# Patient Record
Sex: Female | Born: 1995 | Race: White | Hispanic: No | Marital: Single | Smoking: Former smoker
Health system: Southern US, Community
[De-identification: ages and names within clinical notes are randomized; demographics above are authoritative.]

## PROBLEM LIST (undated history)

## (undated) DIAGNOSIS — R519 Headache, unspecified: Secondary | ICD-10-CM

## (undated) DIAGNOSIS — R011 Cardiac murmur, unspecified: Secondary | ICD-10-CM

## (undated) HISTORY — DX: Headache, unspecified: R51.9

## (undated) HISTORY — DX: Cardiac murmur, unspecified: R01.1

## (undated) HISTORY — PX: TONSILLECTOMY: SUR1361

---
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
## 2019-07-18 ENCOUNTER — Encounter: Payer: Self-pay | Admitting: *Deleted

## 2019-07-19 ENCOUNTER — Encounter: Payer: Self-pay | Admitting: Certified Nurse Midwife

## 2019-07-19 ENCOUNTER — Other Ambulatory Visit: Payer: Self-pay

## 2019-07-19 ENCOUNTER — Other Ambulatory Visit (HOSPITAL_COMMUNITY)
Admission: RE | Admit: 2019-07-19 | Discharge: 2019-07-19 | Disposition: A | Discharge status: Home / Self Care | Payer: 59 | Source: Ambulatory Visit | Attending: Certified Nurse Midwife | Admitting: Certified Nurse Midwife

## 2019-07-19 ENCOUNTER — Ambulatory Visit (INDEPENDENT_AMBULATORY_CARE_PROVIDER_SITE_OTHER): Payer: 59 | Admitting: Certified Nurse Midwife

## 2019-07-19 VITALS — BP 127/64 | HR 75 | Temp 98.9°F | Ht 62.5 in | Wt 176.0 lb

## 2019-07-19 DIAGNOSIS — O99211 Obesity complicating pregnancy, first trimester: Secondary | ICD-10-CM

## 2019-07-19 DIAGNOSIS — Z3400 Encounter for supervision of normal first pregnancy, unspecified trimester: Secondary | ICD-10-CM | POA: Diagnosis not present

## 2019-07-19 DIAGNOSIS — O99210 Obesity complicating pregnancy, unspecified trimester: Secondary | ICD-10-CM | POA: Insufficient documentation

## 2019-07-19 DIAGNOSIS — Z3A09 9 weeks gestation of pregnancy: Secondary | ICD-10-CM

## 2019-07-19 DIAGNOSIS — E669 Obesity, unspecified: Secondary | ICD-10-CM | POA: Diagnosis not present

## 2019-07-19 NOTE — Progress Notes (Signed)
Bedside U/S shows single IUP with FHT of 165 BPM and CRL measures 30.13mm  GA [redacted]w[redacted]d

## 2019-07-19 NOTE — Progress Notes (Signed)
History:   Lori Frederick is a 23 y.o. G1P0 at [redacted]w[redacted]d by LMP being seen today for her first obstetrical visit.  Her obstetrical history is significant for obesity. Patient does intend to breast feed. Pregnancy history fully reviewed.  Patient reports no complaints.     HISTORY: OB History  Gravida Para Term Preterm AB Living  1 0 0 0 0 0  SAB TAB Ectopic Multiple Live Births  0 0 0 0 0    # Outcome Date GA Lbr Len/2nd Weight Sex Delivery Anes PTL Lv  1 Current            She has not had pap smear in past. She denies vaginal discharge or odor.   Past Medical History:  Diagnosis Date  . Headache   . Heart murmur    Past Surgical History:  Procedure Laterality Date  . TONSILLECTOMY     Family History  Adopted: Yes   Social History   Tobacco Use  . Smoking status: Former Games developer  . Smokeless tobacco: Former Engineer, water Use Topics  . Alcohol use: Not Currently  . Drug use: Never   Allergies  Allergen Reactions  . Acetaminophen Rash   Current Outpatient Medications on File Prior to Visit  Medication Sig Dispense Refill  . Prenatal Vit-Fe Fumarate-FA (PRENATAL VITAMINS PO) Take by mouth.     No current facility-administered medications on file prior to visit.    Review of Systems Pertinent items noted in HPI and remainder of comprehensive ROS otherwise negative. Physical Exam:   Vitals:   07/19/19 0945 07/19/19 1010  BP: 127/64   Pulse: 75   Temp: 98.9 F (37.2 C)   Weight: 176 lb (79.8 kg)   Height:  5' 2.5" (1.588 m)   Fetal Heart Rate (bpm): 165 Pelvic Exam: Perineum: no hemorrhoids, normal perineum   Vulva: normal external genitalia, no lesions   Vagina:  normal mucosa, normal discharge   Cervix: no lesions and normal, pap smear done.    Adnexa: normal adnexa and no mass, fullness, tenderness   Bony Pelvis: average  System: General: well-developed, well-nourished female in no acute distress   Breasts:  normal appearance, no masses or  tenderness bilaterally   Skin: normal coloration and turgor, no rashes   Neurologic: oriented, normal, negative, normal mood   Extremities: normal strength, tone, and muscle mass, ROM of all joints is normal   HEENT PERRLA, extraocular movement intact and sclera clear, anicteric   Mouth/Teeth mucous membranes moist, pharynx normal without lesions and dental hygiene good   Neck supple and no masses   Cardiovascular: regular rate and rhythm   Respiratory:  no respiratory distress, normal breath sounds   Abdomen: soft, non-tender; bowel sounds normal; no masses,  no organomegaly     Assessment:    Pregnancy: G1P0 Patient Active Problem List   Diagnosis Date Noted  . Supervision of normal first pregnancy, antepartum 07/19/2019  . Obesity during pregnancy, antepartum 07/19/2019     Plan:    1. Supervision of normal first pregnancy, antepartum - Welcomed to practice and congratulated patient on pregnancy  - Reviewed safety, visitor policy, reassurance about COVID-19 for pregnancy at this time. Discussed possible changes to visits, including televisits, that may occur due to COVID-19.  The office remains open if pt needs to be seen and [facility] is open 24 hours/day for OB emergencies. - Routine prenatal care  - Anticipatory guidance on upcoming appointments including next visit in person to assess [facility]  with doppler then following appointment mychart  - Obstetric panel - HIV antibody (with reflex) - Cytology - PAP( [place]) - [place] bedside; Future - Babyscripts Schedule Optimization - Hepatitis C Antibody - Culture, OB Urine   2. Obesity during pregnancy, antepartum - HgB A1c - patient does not know fam hx as she was adopted    Initial labs drawn. Continue prenatal vitamins. Genetic Screening discussed, NIPS: requested. Ultrasound discussed; fetal anatomic survey: requested. Problem list reviewed and updated. The nature of [place] with  multiple MDs and other Advanced Practice Providers was explained to patient; also emphasized that residents, students are part of our team. Routine obstetric precautions reviewed. Return in about 4 weeks (around 08/16/2019) for ROB.     Lajean Manes, [place] for [employer], [place]

## 2019-07-19 NOTE — Patient Instructions (Signed)
First Trimester of Pregnancy  The first trimester of pregnancy is from week 1 until the end of week 13 (months 1 through 3). During this time, your baby will begin to develop inside you. At 6-8 weeks, the eyes and face are formed, and the heartbeat can be seen on ultrasound. At the end of 12 weeks, all the baby's organs are formed. Prenatal care is all the medical care you receive before the birth of your baby. Make sure you get good prenatal care and follow all of your doctor's instructions. Follow these instructions at home: Medicines  Take over-the-counter and prescription medicines only as told by your doctor. Some medicines are safe and some medicines are not safe during pregnancy.  Take a prenatal vitamin that contains at least 600 micrograms (mcg) of folic acid.  If you have trouble pooping (constipation), take medicine that will make your stool soft (stool softener) if your doctor approves. Eating and drinking   Eat regular, healthy meals.  Your doctor will tell you the amount of weight gain that is right for you.  Avoid raw meat and uncooked cheese.  If you feel sick to your stomach (nauseous) or throw up (vomit): ? Eat 4 or 5 small meals a day instead of 3 large meals. ? Try eating a few soda crackers. ? Drink liquids between meals instead of during meals.  To prevent constipation: ? Eat foods that are high in fiber, like fresh fruits and vegetables, whole grains, and beans. ? Drink enough fluids to keep your pee (urine) clear or pale yellow. Activity  Exercise only as told by your doctor. Stop exercising if you have cramps or pain in your lower belly (abdomen) or low back.  Do not exercise if it is too hot, too humid, or if you are in a place of great height (high altitude).  Try to avoid standing for long periods of time. Move your legs often if you must stand in one place for a long time.  Avoid heavy lifting.  Wear low-heeled shoes. Sit and stand up  straight.  You can have sex unless your doctor tells you not to. Relieving pain and discomfort  Wear a good support bra if your breasts are sore.  Take warm water baths (sitz baths) to soothe pain or discomfort caused by hemorrhoids. Use hemorrhoid cream if your doctor says it is okay.  Rest with your legs raised if you have leg cramps or low back pain.  If you have puffy, bulging veins (varicose veins) in your legs: ? Wear support hose or compression stockings as told by your doctor. ? Raise (elevate) your feet for 15 minutes, 3-4 times a day. ? Limit salt in your food. Prenatal care  Schedule your prenatal visits by the twelfth week of pregnancy.  Write down your questions. Take them to your prenatal visits.  Keep all your prenatal visits as told by your doctor. This is important. Safety  Wear your seat belt at all times when driving.  Make a list of emergency phone numbers. The list should include numbers for family, friends, the hospital, and police and fire departments. General instructions  Ask your doctor for a referral to a local prenatal class. Begin classes no later than at the start of month 6 of your pregnancy.  Ask for help if you need counseling or if you need help with nutrition. Your doctor can give you advice or tell you where to go for help.  Do not use hot tubs, steam   rooms, or saunas.  Do not douche or use tampons or scented sanitary pads.  Do not cross your legs for long periods of time.  Avoid all herbs and alcohol. Avoid drugs that are not approved by your doctor.  Do not use any tobacco products, including cigarettes, chewing tobacco, and electronic cigarettes. If you need help quitting, ask your doctor. You may get counseling or other support to help you quit.  Avoid cat litter boxes and soil used by cats. These carry germs that can cause birth defects in the baby and can cause a loss of your baby (miscarriage) or stillbirth.  Visit your dentist.  At home, brush your teeth with a soft toothbrush. Be gentle when you floss. Contact a doctor if:  You are dizzy.  You have mild cramps or pressure in your lower belly.  You have a nagging pain in your belly area.  You continue to feel sick to your stomach, you throw up, or you have watery poop (diarrhea).  You have a bad smelling fluid coming from your vagina.  You have pain when you pee (urinate).  You have increased puffiness (swelling) in your face, hands, legs, or ankles. Get help right away if:  You have a fever.  You are leaking fluid from your vagina.  You have spotting or bleeding from your vagina.  You have very bad belly cramping or pain.  You gain or lose weight rapidly.  You throw up blood. It may look like coffee grounds.  You are around people who have German measles, fifth disease, or chickenpox.  You have a very bad headache.  You have shortness of breath.  You have any kind of trauma, such as from a fall or a car accident. Summary  The first trimester of pregnancy is from week 1 until the end of week 13 (months 1 through 3).  To take care of yourself and your unborn baby, you will need to eat healthy meals, take medicines only if your doctor tells you to do so, and do activities that are safe for you and your baby.  Keep all follow-up visits as told by your doctor. This is important as your doctor will have to ensure that your baby is healthy and growing well. This information is not intended to replace advice given to you by your health care provider. Make sure you discuss any questions you have with your health care provider. Document Revised: 07/01/2018 Document Reviewed: 03/18/2016 Elsevier Patient Education  2020 [employer]  Safe Medications in Pregnancy   Acne: Benzoyl Peroxide Salicylic Acid  Backache/Headache: Tylenol: 2 regular strength every 4 hours OR              2 Extra strength every 6  hours  Colds/Coughs/Allergies: Benadryl (alcohol free) 25 mg every 6 hours as needed Breath right strips Claritin Cepacol throat lozenges Chloraseptic throat spray Cold-Eeze- up to three times per day Cough drops, alcohol free Flonase (by prescription only) Guaifenesin Mucinex Robitussin DM (plain only, alcohol free) Saline nasal spray/drops Sudafed (pseudoephedrine) & Actifed ** use only after [redacted] weeks gestation and if you do not have high blood pressure Tylenol Vicks Vaporub Zinc lozenges Zyrtec   Constipation: Colace Ducolax suppositories Fleet enema Glycerin suppositories Metamucil Milk of magnesia Miralax Senokot Smooth move tea  Diarrhea: Kaopectate Imodium A-D  *NO pepto Bismol  Hemorrhoids: Anusol Anusol HC Preparation H Tucks  Indigestion: Tums Maalox Mylanta Zantac  Pepcid  Insomnia: Benadryl (alcohol free) 25mg every 6 hours as needed   Tylenol PM Unisom, no Gelcaps  Leg Cramps: Tums MagGel  Nausea/Vomiting:  Bonine Dramamine Emetrol Ginger extract Sea bands Meclizine  Nausea medication to take during pregnancy:  Unisom (doxylamine succinate 25 mg tablets) Take one tablet daily at bedtime. If symptoms are not adequately controlled, the dose can be increased to a maximum recommended dose of two tablets daily (1/2 tablet in the morning, 1/2 tablet mid-afternoon and one at bedtime). Vitamin B6 100mg tablets. Take one tablet twice a day (up to 200 mg per day).  Skin Rashes: Aveeno products Benadryl cream or 25mg every 6 hours as needed Calamine Lotion 1% cortisone cream  Yeast infection: Gyne-lotrimin 7 Monistat 7   **If taking multiple medications, please check labels to avoid duplicating the same active ingredients **take medication as directed on the label ** Do not exceed 4000 mg of tylenol in 24 hours **Do not take medications that contain aspirin or ibuprofen   

## 2019-07-20 LAB — OBSTETRIC PANEL
Absolute Monocytes: 859 cells/uL (ref 200–950)
Antibody Screen: NOT DETECTED
Basophils Absolute: 17 cells/uL (ref 0–200)
Basophils Relative: 0.2 %
Eosinophils Absolute: 111 cells/uL (ref 15–500)
Eosinophils Relative: 1.3 %
HCT: 39.6 % (ref 35.0–45.0)
Hemoglobin: 13.2 g/dL (ref 11.7–15.5)
Hepatitis B Surface Ag: NONREACTIVE
Lymphs Abs: 1564 cells/uL (ref 850–3900)
MCH: 29.2 pg (ref 27.0–33.0)
MCHC: 33.3 g/dL (ref 32.0–36.0)
MCV: 87.6 fL (ref 80.0–100.0)
MPV: 11.1 fL (ref 7.5–12.5)
Monocytes Relative: 10.1 %
Neutro Abs: 5950 cells/uL (ref 1500–7800)
Neutrophils Relative %: 70 %
Platelets: 284 10*3/uL (ref 140–400)
RBC: 4.52 10*6/uL (ref 3.80–5.10)
RDW: 12.5 % (ref 11.0–15.0)
RPR Ser Ql: NONREACTIVE
Rubella: 3.2 Index
Total Lymphocyte: 18.4 %
WBC: 8.5 10*3/uL (ref 3.8–10.8)

## 2019-07-20 LAB — HEPATITIS C ANTIBODY
Hepatitis C Ab: NONREACTIVE
SIGNAL TO CUT-OFF: 0 (ref <1.00)

## 2019-07-20 LAB — HEMOGLOBIN A1C
Hgb A1c MFr Bld: 4.9 % of total Hgb (ref <5.7)
Mean Plasma Glucose: 94 (calc)
eAG (mmol/L): 5.2 (calc)

## 2019-07-20 LAB — HIV ANTIBODY (ROUTINE TESTING W REFLEX): HIV 1&2 Ab, 4th Generation: NONREACTIVE

## 2019-07-21 LAB — CYTOLOGY - PAP
Chlamydia: NEGATIVE
Comment: NEGATIVE
Comment: NEGATIVE
Comment: NORMAL
Diagnosis: NEGATIVE
Neisseria Gonorrhea: NEGATIVE
Trichomonas: NEGATIVE

## 2019-07-21 LAB — CULTURE, OB URINE

## 2019-07-21 LAB — URINE CULTURE, OB REFLEX: Organism ID, Bacteria: NO GROWTH

## 2019-08-09 ENCOUNTER — Ambulatory Visit (INDEPENDENT_AMBULATORY_CARE_PROVIDER_SITE_OTHER): Payer: 59

## 2019-08-09 ENCOUNTER — Other Ambulatory Visit: Payer: Self-pay

## 2019-08-09 DIAGNOSIS — Z3401 Encounter for supervision of normal first pregnancy, first trimester: Secondary | ICD-10-CM | POA: Diagnosis not present

## 2019-08-09 NOTE — Progress Notes (Signed)
Pt here for blood draw for [facility].

## 2019-08-15 ENCOUNTER — Encounter: Payer: Self-pay | Admitting: *Deleted

## 2019-08-15 DIAGNOSIS — Z3400 Encounter for supervision of normal first pregnancy, unspecified trimester: Secondary | ICD-10-CM

## 2019-08-16 ENCOUNTER — Encounter: Payer: Self-pay | Admitting: Certified Nurse Midwife

## 2019-08-16 ENCOUNTER — Encounter: Payer: Self-pay | Admitting: *Deleted

## 2019-08-16 ENCOUNTER — Ambulatory Visit (INDEPENDENT_AMBULATORY_CARE_PROVIDER_SITE_OTHER): Payer: 59 | Admitting: Certified Nurse Midwife

## 2019-08-16 ENCOUNTER — Other Ambulatory Visit: Payer: Self-pay

## 2019-08-16 VITALS — BP 103/59 | HR 65 | Wt 174.0 lb

## 2019-08-16 DIAGNOSIS — Z3400 Encounter for supervision of normal first pregnancy, unspecified trimester: Secondary | ICD-10-CM

## 2019-08-16 DIAGNOSIS — O99211 Obesity complicating pregnancy, first trimester: Secondary | ICD-10-CM

## 2019-08-16 DIAGNOSIS — Z3A13 13 weeks gestation of pregnancy: Secondary | ICD-10-CM

## 2019-08-16 DIAGNOSIS — O99210 Obesity complicating pregnancy, unspecified trimester: Secondary | ICD-10-CM

## 2019-08-16 MED ORDER — ASPIRIN EC 81 MG PO TBEC: 81.0000 mg | DELAYED_RELEASE_TABLET | Freq: Every day | ORAL | 0 refills | Status: DC

## 2019-08-16 NOTE — Patient Instructions (Signed)

## 2019-08-16 NOTE — Progress Notes (Signed)
   PRENATAL VISIT NOTE  Subjective:  Lori Frederick is a 24 y.o. G1P0 at [redacted]w[redacted]d being seen today for ongoing prenatal care.  She is currently monitored for the following issues for this low-risk pregnancy and has Supervision of normal first pregnancy, antepartum and Obesity during pregnancy, antepartum on their problem list.  Patient reports no complaints.  Contractions: Not present. Vag. Bleeding: None.  Movement: Absent. Denies leaking of fluid.   The following portions of the patient's history were reviewed and updated as appropriate: allergies, current medications, past family history, past medical history, past social history, past surgical history and problem list.   Objective:   Vitals:   08/16/19 1104  BP: (!) 103/59  Pulse: 65  Weight: 174 lb (78.9 kg)    Fetal Status: Fetal Heart Rate (bpm): 147   Movement: Absent     General:  Alert, oriented and cooperative. Patient is in no acute distress.  Skin: Skin is warm and dry. No rash noted.   Cardiovascular: Normal heart rate noted  Respiratory: Normal respiratory effort, no problems with respiration noted  Abdomen: Soft, gravid, appropriate for gestational age.  Pain/Pressure: Absent     Pelvic: Cervical exam deferred        Extremities: Normal range of motion.  Edema: None  Mental Status: Normal mood and affect. Normal behavior. Normal judgment and thought content.   Assessment and Plan:  Pregnancy: G1P0 at [redacted]w[redacted]d 1. Supervision of normal first pregnancy, antepartum - patient doing well, no complaints - educated and discussed fetal movement during pregnancy and when to expect around 18-20 weeks  - routine prenatal care - anticipatory guidance on upcoming appointments including ultrasound appointment and mychart appointment  - [place] MFM OB COMP + 14 WK; Future  2. Obesity during pregnancy, antepartum - educated and discussed recommended weight gain during pregnancy  - aspirin EC 81 MG tablet; Take 1 tablet (81 mg total)  by mouth daily. Take after 12 weeks for prevention of preeclampsia later in pregnancy  Dispense: 300 tablet; Refill: 0  Preterm labor symptoms and general obstetric precautions including but not limited to vaginal bleeding, contractions, leaking of fluid and fetal movement were reviewed in detail with the patient. Please refer to After Visit Summary for other counseling recommendations.   Return in about 4 weeks (around 09/13/2019) for ROB-mychart.  Future Appointments  Date Time Provider Department Center  09/13/2019  9:35 AM Rasch, Harolyn Rutherford, NP [email or web address]    Sharyon Cable, CNM

## 2019-08-29 ENCOUNTER — Telehealth: Payer: Self-pay | Admitting: *Deleted

## 2019-08-29 ENCOUNTER — Inpatient Hospital Stay (HOSPITAL_COMMUNITY)
Admission: AD | Admit: 2019-08-29 | Discharge: 2019-08-29 | Disposition: A | Discharge status: Home / Self Care | Payer: 59 | Attending: Obstetrics and Gynecology | Admitting: Obstetrics and Gynecology

## 2019-08-29 ENCOUNTER — Encounter (HOSPITAL_COMMUNITY): Payer: Self-pay | Admitting: Obstetrics and Gynecology

## 2019-08-29 ENCOUNTER — Other Ambulatory Visit: Payer: Self-pay

## 2019-08-29 DIAGNOSIS — R109 Unspecified abdominal pain: Secondary | ICD-10-CM | POA: Diagnosis not present

## 2019-08-29 DIAGNOSIS — Z79899 Other long term (current) drug therapy: Secondary | ICD-10-CM | POA: Insufficient documentation

## 2019-08-29 DIAGNOSIS — Z886 Allergy status to analgesic agent status: Secondary | ICD-10-CM | POA: Insufficient documentation

## 2019-08-29 DIAGNOSIS — N949 Unspecified condition associated with female genital organs and menstrual cycle: Secondary | ICD-10-CM

## 2019-08-29 DIAGNOSIS — Z7982 Long term (current) use of aspirin: Secondary | ICD-10-CM | POA: Insufficient documentation

## 2019-08-29 DIAGNOSIS — Z3A15 15 weeks gestation of pregnancy: Secondary | ICD-10-CM

## 2019-08-29 DIAGNOSIS — Z87891 Personal history of nicotine dependence: Secondary | ICD-10-CM | POA: Insufficient documentation

## 2019-08-29 DIAGNOSIS — O26892 Other specified pregnancy related conditions, second trimester: Secondary | ICD-10-CM

## 2019-08-29 LAB — URINALYSIS, ROUTINE W REFLEX MICROSCOPIC
Bilirubin Urine: NEGATIVE
Glucose, UA: NEGATIVE mg/dL
Hgb urine dipstick: NEGATIVE
Ketones, ur: NEGATIVE mg/dL
Leukocytes,Ua: NEGATIVE
Nitrite: NEGATIVE
Protein, ur: NEGATIVE mg/dL
Specific Gravity, Urine: 1.013 (ref 1.005–1.030)
pH: 7 (ref 5.0–8.0)

## 2019-08-29 MED ORDER — COMFORT FIT MATERNITY SUPP SM MISC
1.0000 [IU] | Freq: Every day | 0 refills | Status: DC | PRN
Start: 2019-08-29 — End: 2020-05-11

## 2019-08-29 NOTE — Discharge Instructions (Signed)
PREGNANCY SUPPORT BELT: You are not alone, Seventy-five percent of women have some sort of abdominal or back pain at some point in their pregnancy. Your baby is growing at a fast pace, which means that your whole body is rapidly trying to adjust to the changes. As your uterus grows, your back may start feeling a bit under stress and this can result in back or abdominal pain that can go from mild, and therefore bearable, to severe pains that will not allow you to sit or lay down comfortably, When it comes to dealing with pregnancy-related pains and cramps, some pregnant women usually prefer natural remedies, which the market is filled with nowadays. For example, wearing a pregnancy support belt can help ease and lessen your discomfort and pain. WHAT ARE THE BENEFITS OF WEARING A PREGNANCY SUPPORT BELT? A pregnancy support belt provides support to the lower portion of the belly taking some of the weight of the growing uterus and distributing to the other parts of your body. It is designed make you comfortable and gives you extra support. Over the years, the pregnancy apparel market has been studying the needs and wants of pregnant women and they have come up with the most comfortable pregnancy support belts that woman could ever ask for. In fact, you will no longer have to wear a stretched-out or bulky pregnancy belt that is visible underneath your clothes and makes you feel even more uncomfortable. Nowadays, a pregnancy support belt is made of comfortable and stretchy materials that will not irritate your skin but will actually make you feel at ease and you will not even notice you are wearing it. They are easy to put on and adjust during the day and can be worn at night for additional support.  BENEFITS:  Relives Back pain  Relieves Abdominal Muscle and Leg Pain  Stabilizes the Pelvic Ring  Offers a Cushioned Abdominal Lift Pad  Relieves pressure on the Sciatic Nerve Within Minutes WHERE TO GET  YOUR PREGNANCY BELT: [employer] [phone] @[phone]  [address] [place], [place]      Round Ligament Pain  The round ligament is a cord of muscle and tissue that helps support the uterus. It can become a source of pain during pregnancy if it becomes stretched or twisted as the baby grows. The pain usually begins in the second trimester (13-28 weeks) of pregnancy, and it can come and go until the baby is delivered. It is not a serious problem, and it does not cause harm to the baby. Round ligament pain is usually a short, sharp, and pinching pain, but it can also be a dull, lingering, and aching pain. The pain is felt in the lower side of the abdomen or in the groin. It usually starts deep in the groin and moves up to the outside of the hip area. The pain may occur when you:  Suddenly change position, such as quickly going from a sitting to standing position.  Roll over in bed.  Cough or sneeze.  Do physical activity. Follow these instructions at home:   Watch your condition for any changes.  When the pain starts, relax. Then try any of these methods to help with the pain: ? Sitting down. ? Flexing your knees up to your abdomen. ? Lying on your side with one pillow under your abdomen and another pillow between your legs. ? Sitting in a warm bath for 15-20 minutes or until the pain goes away.  Take  over-the-counter and prescription medicines only as told by your health care provider.  Move slowly when you sit down or stand up.  Avoid long walks if they cause pain.  Stop or reduce your physical activities if they cause pain.  Keep all follow-up visits as told by your health care provider. This is important. Contact a health care provider if:  Your pain does not go away with treatment.  You feel pain in your back that you did not have before.  Your medicine is not helping. Get help right away if:  You have a fever or chills.  You develop uterine  contractions.  You have vaginal bleeding.  You have nausea or vomiting.  You have diarrhea.  You have pain when you urinate. Summary  Round ligament pain is felt in the lower abdomen or groin. It is usually a short, sharp, and pinching pain. It can also be a dull, lingering, and aching pain.  This pain usually begins in the second trimester (13-28 weeks). It occurs because the uterus is stretching with the growing baby, and it is not harmful to the baby.  You may notice the pain when you suddenly change position, when you cough or sneeze, or during physical activity.  Relaxing, flexing your knees to your abdomen, lying on one side, or taking a warm bath may help to get rid of the pain.  Get help from your health care provider if the pain does not go away or if you have vaginal bleeding, nausea, vomiting, diarrhea, or painful urination. This information is not intended to replace advice given to you by your health care provider. Make sure you discuss any questions you have with your health care provider. Document Revised: 08/26/2017 Document Reviewed: 08/26/2017 Elsevier Patient Education  2020 Else[employer] Inc.         Abdominal Pain During Pregnancy  Abdominal pain is common during pregnancy, and has many possible causes. Some causes are more serious than others, and sometimes the cause is not known. Abdominal pain can be a sign that labor is starting. It can also be caused by normal growth and stretching of muscles and ligaments during pregnancy. Always tell your health care provider if you have any abdominal pain. Follow these instructions at home:  Do not have sex or put anything in your vagina until your pain goes away completely.  Get plenty of rest until your pain improves.  Drink enough fluid to keep your urine pale yellow.  Take over-the-counter and prescription medicines only as told by your health care provider.  Keep all follow-up visits as told by your health  care provider. This is important. Contact a health care provider if:  Your pain continues or gets worse after resting.  You have lower abdominal pain that: ? Comes and goes at regular intervals. ? Spreads to your back. ? Is similar to menstrual cramps.  You have pain or burning when you urinate. Get help right away if:  You have a fever or chills.  You have vaginal bleeding.  You are leaking fluid from your vagina.  You are passing tissue from your vagina.  You have vomiting or diarrhea that lasts for more than 24 hours.  Your baby is moving less than usual.  You feel very weak or faint.  You have shortness of breath.  You develop severe pain in your upper abdomen. Summary  Abdominal pain is common during pregnancy, and has many possible causes.  If you experience abdominal pain during pregnancy, tell  your health care provider right away.  Follow your health care provider's home care instructions and keep all follow-up visits as directed. This information is not intended to replace advice given to you by your health care provider. Make sure you discuss any questions you have with your health care provider. Document Revised: 06/28/2018 Document Reviewed: 06/12/2016 Elsevier Patient Education  2020 [employer].        Second Trimester of Pregnancy The second trimester is from week 14 through week 27 (months 4 through 6). The second trimester is often a time when you feel your best. Your body has adjusted to being pregnant, and you begin to feel better physically. Usually, morning sickness has lessened or quit completely, you may have more energy, and you may have an increase in appetite. The second trimester is also a time when the fetus is growing rapidly. At the end of the sixth month, the fetus is about 9 inches long and weighs about 1 pounds. You will likely begin to feel the baby move (quickening) between 16 and 20 weeks of pregnancy. Body changes during your  second trimester Your body continues to go through many changes during your second trimester. The changes vary from woman to woman.  Your weight will continue to increase. You will notice your lower abdomen bulging out.  You may begin to get stretch marks on your hips, abdomen, and breasts.  You may develop headaches that can be relieved by medicines. The medicines should be approved by your health care provider.  You may urinate more often because the fetus is pressing on your bladder.  You may develop or continue to have heartburn as a result of your pregnancy.  You may develop constipation because certain hormones are causing the muscles that push waste through your intestines to slow down.  You may develop hemorrhoids or swollen, bulging veins (varicose veins).  You may have back pain. This is caused by: ? Weight gain. ? Pregnancy hormones that are relaxing the joints in your pelvis. ? A shift in weight and the muscles that support your balance.  Your breasts will continue to grow and they will continue to become tender.  Your gums may bleed and may be sensitive to brushing and flossing.  Dark spots or blotches (chloasma, mask of pregnancy) may develop on your face. This will likely fade after the baby is born.  A dark line from your belly button to the pubic area (linea nigra) may appear. This will likely fade after the baby is born.  You may have changes in your hair. These can include thickening of your hair, rapid growth, and changes in texture. Some women also have hair loss during or after pregnancy, or hair that feels dry or thin. Your hair will most likely return to normal after your baby is born. What to expect at prenatal visits During a routine prenatal visit:  You will be weighed to make sure you and the fetus are growing normally.  Your blood pressure will be taken.  Your abdomen will be measured to track your baby's growth.  The fetal heartbeat will be  listened to.  Any test results from the previous visit will be discussed. Your health care provider may ask you:  How you are feeling.  If you are feeling the baby move.  If you have had any abnormal symptoms, such as leaking fluid, bleeding, severe headaches, or abdominal cramping.  If you are using any tobacco products, including cigarettes, chewing tobacco, and electronic  cigarettes.  If you have any questions. Other tests that may be performed during your second trimester include:  Blood tests that check for: ? Low iron levels (anemia). ? High blood sugar that affects pregnant women (gestational diabetes) between 78 and 28 weeks. ? Rh antibodies. This is to check for a protein on red blood cells (Rh factor).  Urine tests to check for infections, diabetes, or protein in the urine.  An ultrasound to confirm the proper growth and development of the baby.  An amniocentesis to check for possible genetic problems.  Fetal screens for spina bifida and Down syndrome.  HIV (human immunodeficiency virus) testing. Routine prenatal testing includes screening for HIV, unless you choose not to have this test. Follow these instructions at home: Medicines  Follow your health care provider's instructions regarding medicine use. Specific medicines may be either safe or unsafe to take during pregnancy.  Take a prenatal vitamin that contains at least 600 micrograms (mcg) of folic acid.  If you develop constipation, try taking a stool softener if your health care provider approves. Eating and drinking   Eat a balanced diet that includes fresh fruits and vegetables, whole grains, good sources of protein such as meat, eggs, or tofu, and low-fat dairy. Your health care provider will help you determine the amount of weight gain that is right for you.  Avoid raw meat and uncooked cheese. These carry germs that can cause birth defects in the baby.  If you have low calcium intake from food, talk  to your health care provider about whether you should take a daily calcium supplement.  Limit foods that are high in fat and processed sugars, such as fried and sweet foods.  To prevent constipation: ? Drink enough fluid to keep your urine clear or pale yellow. ? Eat foods that are high in fiber, such as fresh fruits and vegetables, whole grains, and beans. Activity  Exercise only as directed by your health care provider. Most women can continue their usual exercise routine during pregnancy. Try to exercise for 30 minutes at least 5 days a week. Stop exercising if you experience uterine contractions.  Avoid heavy lifting, wear low heel shoes, and practice good posture.  A sexual relationship may be continued unless your health care provider directs you otherwise. Relieving pain and discomfort  Wear a good support bra to prevent discomfort from breast tenderness.  Take warm sitz baths to soothe any pain or discomfort caused by hemorrhoids. Use hemorrhoid cream if your health care provider approves.  Rest with your legs elevated if you have leg cramps or low back pain.  If you develop varicose veins, wear support hose. Elevate your feet for 15 minutes, 3-4 times a day. Limit salt in your diet. Prenatal Care  Write down your questions. Take them to your prenatal visits.  Keep all your prenatal visits as told by your health care provider. This is important. Safety  Wear your seat belt at all times when driving.  Make a list of emergency phone numbers, including numbers for family, friends, the hospital, and police and fire departments. General instructions  Ask your health care provider for a referral to a local prenatal education class. Begin classes no later than the beginning of month 6 of your pregnancy.  Ask for help if you have counseling or nutritional needs during pregnancy. Your health care provider can offer advice or refer you to specialists for help with various  needs.  Do not use hot tubs, steam rooms,  or saunas.  Do not douche or use tampons or scented sanitary pads.  Do not cross your legs for long periods of time.  Avoid cat litter boxes and soil used by cats. These carry germs that can cause birth defects in the baby and possibly loss of the fetus by miscarriage or stillbirth.  Avoid all smoking, herbs, alcohol, and unprescribed drugs. Chemicals in these products can affect the formation and growth of the baby.  Do not use any products that contain nicotine or tobacco, such as cigarettes and e-cigarettes. If you need help quitting, ask your health care provider.  Visit your dentist if you have not gone yet during your pregnancy. Use a soft toothbrush to brush your teeth and be gentle when you floss. Contact a health care provider if:  You have dizziness.  You have mild pelvic cramps, pelvic pressure, or nagging pain in the abdominal area.  You have persistent nausea, vomiting, or diarrhea.  You have a bad smelling vaginal discharge.  You have pain when you urinate. Get help right away if:  You have a fever.  You are leaking fluid from your vagina.  You have spotting or bleeding from your vagina.  You have severe abdominal cramping or pain.  You have rapid weight gain or weight loss.  You have shortness of breath with chest pain.  You notice sudden or extreme swelling of your face, hands, ankles, feet, or legs.  You have not felt your baby move in over an hour.  You have severe headaches that do not go away when you take medicine.  You have vision changes. Summary  The second trimester is from week 14 through week 27 (months 4 through 6). It is also a time when the fetus is growing rapidly.  Your body goes through many changes during pregnancy. The changes vary from woman to woman.  Avoid all smoking, herbs, alcohol, and unprescribed drugs. These chemicals affect the formation and growth your baby.  Do not use any  tobacco products, such as cigarettes, chewing tobacco, and e-cigarettes. If you need help quitting, ask your health care provider.  Contact your health care provider if you have any questions. Keep all prenatal visits as told by your health care provider. This is important. This information is not intended to replace advice given to you by your health care provider. Make sure you discuss any questions you have with your health care provider. Document Revised: 07/02/2018 Document Reviewed: 04/15/2016 Elsevier Patient Education  2020 [employer].

## 2019-08-29 NOTE — MAU Note (Signed)
.  Lori Frederick is a 24 y.o. at [redacted]w[redacted]d here in MAU reporting: she woke up to a sharp pain that would not go away until about 0600. Pt reports she has had cramps all day today. No VB or abnormal discharge.   Onset of complaint: 08/29/19 at 0430 Pain score: 0/10  FHT: 148 doppler

## 2019-08-29 NOTE — MAU Provider Note (Signed)
History     CSN: [id number]  Arrival date and time: 08/29/19 2031   First Provider Initiated Contact with Patient 08/29/19 2125      Chief Complaint  Patient presents with  . Abdominal Pain   [name] is a 24 y.o. G1P0 at [redacted]w[redacted]d who presents to MAU for abdominal pain.  Onset: 0600AM Location: bilateral pelvis Duration: 1.5hours this morning, shorter durations since then Character: sharp, "felt like something was coming through my stomach" Aggravating/Associated: movement/none Relieving: lying still Treatment: none Severity: 0/10  Pt denies VB, LOF, ctx, vaginal discharge/odor/itching. Pt denies N/V, abdominal pain, constipation, diarrhea, or urinary problems. Pt denies fever, chills, fatigue, sweating or changes in appetite. Pt denies SOB or chest pain. Pt denies dizziness, HA, light-headedness, weakness.  Problems this pregnancy include: obesity. Allergies? ibuprofen Current medications/supplements? BASA, PNVs Prenatal care provider? [facility] [place], next appt 09/13/2019   OB History    Gravida  1   Para      Term      Preterm      AB      Living        SAB      TAB      Ectopic      Multiple      Live Births              Past Medical History:  Diagnosis Date  . Headache   . Heart murmur     Past Surgical History:  Procedure Laterality Date  . TONSILLECTOMY      Family History  Adopted: Yes    Social History   Tobacco Use  . Smoking status: Former Games developer  . Smokeless tobacco: Former Engineer, water Use Topics  . Alcohol use: Not Currently  . Drug use: Never    Allergies:  Allergies  Allergen Reactions  . Ibuprofen Itching and Swelling    Medications Prior to Admission  Medication Sig Dispense Refill Last Dose  . acetaminophen (TYLENOL) 500 MG tablet Take 500 mg by mouth every 6 (six) hours as needed.   Past Month at Unknown time  . aspirin EC 81 MG tablet Take 1 tablet (81 mg total) by mouth daily. Take  after 12 weeks for prevention of preeclampsia later in pregnancy 300 tablet 0 Past Week at Unknown time  . Prenatal Vit-Fe Fumarate-FA (PRENATAL VITAMINS PO) Take by mouth.   08/29/2019 at Unknown time    Review of Systems  Constitutional: Negative for chills, diaphoresis, fatigue and fever.  Eyes: Negative for visual disturbance.  Respiratory: Negative for shortness of breath.   Cardiovascular: Negative for chest pain.  Gastrointestinal: Positive for abdominal pain. Negative for constipation, diarrhea, nausea and vomiting.  Genitourinary: Negative for dysuria, flank pain, frequency, pelvic pain, urgency, vaginal bleeding and vaginal discharge.  Neurological: Negative for dizziness, weakness, light-headedness and headaches.   Physical Exam   Blood pressure 109/60, pulse 71, temperature 98.1 F (36.7 C), temperature source Oral, resp. rate 17, weight 82.1 kg, last menstrual period 05/15/2019, SpO2 99 %.  Patient Vitals for the past 24 hrs:  BP Temp Temp src Pulse Resp SpO2 Weight  08/29/19 2136 109/60 -- -- 71 -- -- --  08/29/19 2106 124/87 98.1 F (36.7 C) Oral 92 17 99 % --  08/29/19 2059 -- -- -- -- -- -- 82.1 kg   Physical Exam  Constitutional: She is oriented to person, place, and time. She appears well-developed and well-nourished. No distress.  HENT:  Head: Normocephalic and atraumatic.  Respiratory: Effort normal.  GI: Soft. She exhibits no distension and no mass. There is no abdominal tenderness. There is no rebound and no guarding.  Genitourinary: There is no rash, tenderness or lesion on the right labia. There is no rash, tenderness or lesion on the left labia.    Genitourinary Comments: CE: long/closed/posterior   Neurological: She is alert and oriented to person, place, and time.  Skin: Skin is warm and dry. She is not diaphoretic.  Psychiatric: She has a normal mood and affect. Her behavior is normal. Judgment and thought content normal.   Results for orders placed or  performed during the hospital encounter of 08/29/19 (from the past 24 hour(s))  Urinalysis, Routine w reflex microscopic     Status: Abnormal   Collection Time: 08/29/19  8:59 PM  Result Value Ref Range   Color, Urine YELLOW YELLOW   APPearance HAZY (A) CLEAR   Specific Gravity, Urine 1.013 1.005 - 1.030   pH 7.0 5.0 - 8.0   Glucose, UA NEGATIVE NEGATIVE mg/dL   Hgb urine dipstick NEGATIVE NEGATIVE   Bilirubin Urine NEGATIVE NEGATIVE   Ketones, ur NEGATIVE NEGATIVE mg/dL   Protein, ur NEGATIVE NEGATIVE mg/dL   Nitrite NEGATIVE NEGATIVE   Leukocytes,Ua NEGATIVE NEGATIVE    MAU Course  Procedures  MDM -suspect RLP, r/o PTL -UA: hazy, sending urine for culture based on symptoms -CE: long/closed/posterior -fFN: n/a d/t GA -GC/CT/trich negative on Pap one month ago, no new partners since that time -FHT 148 -pt not experiencing pain at this time, no pain medication given -pt discharged to home in stable condition  Orders Placed This Encounter  Procedures  . Culture, OB Urine    Standing Status:   Standing    Number of Occurrences:   1  . Urinalysis, Routine w reflex microscopic    Standing Status:   Standing    Number of Occurrences:   1  . Discharge patient    Order Specific Question:   Discharge disposition    Answer:   01-Home or Self Care [1]    Order Specific Question:   Discharge patient date    Answer:   08/29/2019   Meds ordered this encounter  Medications  . Elastic Bandages & Supports (COMFORT FIT MATERNITY SUPP SM) MISC    Sig: 1 Units by Does not apply route daily as needed.    Dispense:  1 each    Refill:  0    Order Specific Question:   Supervising Provider    Answer:   Aletha Halim [[id number]]    Assessment and Plan   1. Round ligament pain   2. [redacted] weeks gestation of pregnancy     Allergies as of 08/29/2019      Reactions   Ibuprofen Itching, Swelling      Medication List    TAKE these medications   acetaminophen 500 MG tablet Commonly known  as: TYLENOL Take 500 mg by mouth every 6 (six) hours as needed.   aspirin EC 81 MG tablet Take 1 tablet (81 mg total) by mouth daily. Take after 12 weeks for prevention of preeclampsia later in pregnancy   [place] 1 Units by Does not apply route daily as needed.   PRENATAL VITAMINS PO Take by mouth.        -will call with culture results, if positive -pt declines offer for Flexeril, will try Tylenol at home PRN -RX pregnancy support belt with instructions -round ligament pain vs.  PTL discussed -return MAU precautions given -pt discharged to home in stable condition  Odie Sera Samia Kukla 08/29/2019, 9:47 PM
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
## 2019-08-30 LAB — CULTURE, OB URINE

## 2019-09-13 ENCOUNTER — Encounter: Payer: Self-pay | Admitting: Obstetrics and Gynecology

## 2019-09-13 ENCOUNTER — Telehealth (INDEPENDENT_AMBULATORY_CARE_PROVIDER_SITE_OTHER): Payer: 59 | Admitting: Obstetrics and Gynecology

## 2019-09-13 DIAGNOSIS — Z3401 Encounter for supervision of normal first pregnancy, first trimester: Secondary | ICD-10-CM

## 2019-09-13 DIAGNOSIS — Z3400 Encounter for supervision of normal first pregnancy, unspecified trimester: Secondary | ICD-10-CM

## 2019-09-13 DIAGNOSIS — Z3A17 17 weeks gestation of pregnancy: Secondary | ICD-10-CM

## 2019-09-13 NOTE — Patient Instructions (Signed)
Contraception Choices Contraception, also called birth control, refers to methods or devices that prevent pregnancy. Hormonal methods Contraceptive implant  A contraceptive implant is a thin, plastic tube that contains a hormone. It is inserted into the upper part of the arm. It can remain in place for up to 3 years. Progestin-only injections Progestin-only injections are injections of progestin, a synthetic form of the hormone progesterone. They are given every 3 months by a health care provider. Birth control pills  Birth control pills are pills that contain hormones that prevent pregnancy. They must be taken once a day, preferably at the same time each day. Birth control patch  The birth control patch contains hormones that prevent pregnancy. It is placed on the skin and must be changed once a week for three weeks and removed on the fourth week. A prescription is needed to use this method of contraception. Vaginal ring  A vaginal ring contains hormones that prevent pregnancy. It is placed in the vagina for three weeks and removed on the fourth week. After that, the process is repeated with a new ring. A prescription is needed to use this method of contraception. Emergency contraceptive Emergency contraceptives prevent pregnancy after unprotected sex. They come in pill form and can be taken up to 5 days after sex. They work best the sooner they are taken after having sex. Most emergency contraceptives are available without a prescription. This method should not be used as your only form of birth control. Barrier methods Female condom  A female condom is a thin sheath that is worn over the penis during sex. Condoms keep sperm from going inside a woman's body. They can be used with a spermicide to increase their effectiveness. They should be disposed after a single use. Female condom  A female condom is a soft, loose-fitting sheath that is put into the vagina before sex. The condom keeps sperm  from going inside a woman's body. They should be disposed after a single use. Diaphragm  A diaphragm is a soft, dome-shaped barrier. It is inserted into the vagina before sex, along with a spermicide. The diaphragm blocks sperm from entering the uterus, and the spermicide kills sperm. A diaphragm should be left in the vagina for 6-8 hours after sex and removed within 24 hours. A diaphragm is prescribed and fitted by a health care provider. A diaphragm should be replaced every 1-2 years, after giving birth, after gaining more than 15 lb (6.8 kg), and after pelvic surgery. Cervical cap  A cervical cap is a round, soft latex or plastic cup that fits over the cervix. It is inserted into the vagina before sex, along with spermicide. It blocks sperm from entering the uterus. The cap should be left in place for 6-8 hours after sex and removed within 48 hours. A cervical cap must be prescribed and fitted by a health care provider. It should be replaced every 2 years. Sponge  A sponge is a soft, circular piece of polyurethane foam with spermicide on it. The sponge helps block sperm from entering the uterus, and the spermicide kills sperm. To use it, you make it wet and then insert it into the vagina. It should be inserted before sex, left in for at least 6 hours after sex, and removed and thrown away within 30 hours. Spermicides Spermicides are chemicals that kill or block sperm from entering the cervix and uterus. They can come as a cream, jelly, suppository, foam, or tablet. A spermicide should be inserted into the   vagina with an applicator at least 10-15 minutes before sex to allow time for it to work. The process must be repeated every time you have sex. Spermicides do not require a prescription. Intrauterine contraception Intrauterine device (IUD) An IUD is a T-shaped device that is put in a woman's uterus. There are two types:  Hormone IUD.This type contains progestin, a synthetic form of the hormone  progesterone. This type can stay in place for 3-5 years.  Copper IUD.This type is wrapped in copper wire. It can stay in place for 10 years.  Permanent methods of contraception Female tubal ligation In this method, a woman's fallopian tubes are sealed, tied, or blocked during surgery to prevent eggs from traveling to the uterus. Hysteroscopic sterilization In this method, a small, flexible insert is placed into each fallopian tube. The inserts cause scar tissue to form in the fallopian tubes and block them, so sperm cannot reach an egg. The procedure takes about 3 months to be effective. Another form of birth control must be used during those 3 months. Female sterilization This is a procedure to tie off the tubes that carry sperm (vasectomy). After the procedure, the man can still ejaculate fluid (semen). Natural planning methods Natural family planning In this method, a couple does not have sex on days when the woman could become pregnant. Calendar method This means keeping track of the length of each menstrual cycle, identifying the days when pregnancy can happen, and not having sex on those days. Ovulation method In this method, a couple avoids sex during ovulation. Symptothermal method This method involves not having sex during ovulation. The woman typically checks for ovulation by watching changes in her temperature and in the consistency of cervical mucus. Post-ovulation method In this method, a couple waits to have sex until after ovulation. Summary  Contraception, also called birth control, means methods or devices that prevent pregnancy.  Hormonal methods of contraception include implants, injections, pills, patches, vaginal rings, and emergency contraceptives.  Barrier methods of contraception can include female condoms, female condoms, diaphragms, cervical caps, sponges, and spermicides.  There are two types of IUDs (intrauterine devices). An IUD can be put in a woman's uterus to  prevent pregnancy for 3-5 years.  Permanent sterilization can be done through a procedure for males, females, or both.  Natural family planning methods involve not having sex on days when the woman could become pregnant. This information is not intended to replace advice given to you by your health care provider. Make sure you discuss any questions you have with your health care provider. Document Revised: 03/12/2017 Document Reviewed: 04/12/2016 Elsevier Patient Education  2020 [employer]vier Inc.  

## 2019-09-13 NOTE — Progress Notes (Signed)
Pt will take BP and wt this afternoon and call.

## 2019-09-13 NOTE — Progress Notes (Signed)
   TELEHEALTH OBSTETRICS VISIT ENCOUNTER NOTE  I connected with Lori Frederick on 09/13/19 at  9:35 AM EDT by telephone at home and verified that I am speaking with the correct person using two identifiers.   I discussed the limitations, risks, security and privacy concerns of performing an evaluation and management service by telephone and the availability of in person appointments. I also discussed with the patient that there may be a patient responsible charge related to this service. The patient expressed understanding and agreed to proceed.  Subjective:  Lori Frederick is a 24 y.o. G1P0 at [redacted]w[redacted]d being followed for ongoing prenatal care.  She is currently monitored for the following issues for this low-risk pregnancy and has Supervision of normal first pregnancy, antepartum and Obesity during pregnancy, antepartum on their problem list.  Patient reports no complaints. Reports fetal movement. Denies any contractions, bleeding or leaking of fluid.   The following portions of the patient's history were reviewed and updated as appropriate: allergies, current medications, past family history, past medical history, past social history, past surgical history and problem list.   Objective:   General:  Alert, oriented and cooperative.   Mental Status: Normal mood and affect perceived. Normal judgment and thought content.  Rest of physical exam deferred due to type of encounter  Assessment and Plan:  Pregnancy: G1P0 at [redacted]w[redacted]d  1. Supervision of normal first pregnancy, antepartum  Continue BASA daily Doing well, feeling  Problem list updated Discussed [facility] website. Will check BP later today and enter into babyscripts.    Preterm labor symptoms and general obstetric precautions including but not limited to vaginal bleeding, contractions, leaking of fluid and fetal movement were reviewed in detail with the patient.  I discussed the assessment and treatment plan with the  patient. The patient was provided an opportunity to ask questions and all were answered. The patient agreed with the plan and demonstrated an understanding of the instructions. The patient was advised to call back or seek an in-person office evaluation/go to MAU at [facility] for any urgent or concerning symptoms. Please refer to After Visit Summary for other counseling recommendations.   I provided 10 minutes of non-face-to-face time during this encounter.  Return in about 4 weeks (around 10/11/2019) for In-person visit per patient request. .  Future Appointments  Date Time Provider Department Center  09/27/2019 10:45 AM [facility] US5 [facility]    Venia Carbon, NP [facility] [employer], [facility]

## 2019-09-27 ENCOUNTER — Other Ambulatory Visit: Payer: Self-pay | Admitting: *Deleted

## 2019-09-27 ENCOUNTER — Other Ambulatory Visit: Payer: Self-pay

## 2019-09-27 ENCOUNTER — Ambulatory Visit: Payer: 59 | Attending: Obstetrics and Gynecology

## 2019-09-27 ENCOUNTER — Other Ambulatory Visit: Payer: Self-pay | Admitting: Certified Nurse Midwife

## 2019-09-27 DIAGNOSIS — E669 Obesity, unspecified: Secondary | ICD-10-CM

## 2019-09-27 DIAGNOSIS — Z3400 Encounter for supervision of normal first pregnancy, unspecified trimester: Secondary | ICD-10-CM | POA: Diagnosis not present

## 2019-09-27 DIAGNOSIS — O99212 Obesity complicating pregnancy, second trimester: Secondary | ICD-10-CM

## 2019-09-27 DIAGNOSIS — Z363 Encounter for antenatal screening for malformations: Secondary | ICD-10-CM

## 2019-09-27 DIAGNOSIS — O99210 Obesity complicating pregnancy, unspecified trimester: Secondary | ICD-10-CM

## 2019-09-27 DIAGNOSIS — Z3A19 19 weeks gestation of pregnancy: Secondary | ICD-10-CM

## 2019-10-11 ENCOUNTER — Encounter: Payer: 59 | Admitting: Advanced Practice Midwife

## 2019-10-18 ENCOUNTER — Telehealth (INDEPENDENT_AMBULATORY_CARE_PROVIDER_SITE_OTHER): Payer: 59 | Admitting: Advanced Practice Midwife

## 2019-10-18 VITALS — BP 108/53 | HR 76 | Wt 187.0 lb

## 2019-10-18 DIAGNOSIS — E669 Obesity, unspecified: Secondary | ICD-10-CM

## 2019-10-18 DIAGNOSIS — Z3A22 22 weeks gestation of pregnancy: Secondary | ICD-10-CM

## 2019-10-18 DIAGNOSIS — O26892 Other specified pregnancy related conditions, second trimester: Secondary | ICD-10-CM

## 2019-10-18 DIAGNOSIS — O99212 Obesity complicating pregnancy, second trimester: Secondary | ICD-10-CM

## 2019-10-18 DIAGNOSIS — R0602 Shortness of breath: Secondary | ICD-10-CM

## 2019-10-18 DIAGNOSIS — Z3400 Encounter for supervision of normal first pregnancy, unspecified trimester: Secondary | ICD-10-CM

## 2019-10-18 NOTE — Progress Notes (Signed)
Pt c/o SOB

## 2019-10-18 NOTE — Progress Notes (Signed)
OBSTETRICS PRENATAL VIRTUAL VISIT ENCOUNTER NOTE  Provider location: [facility] for [facility] [place]   I connected with Lori Frederick on 10/18/19 at  3:20 PM EDT by MyChart Video Encounter at home and verified that I am speaking with the correct person using two identifiers.   I discussed the limitations, risks, security and privacy concerns of performing an evaluation and management service virtually and the availability of in person appointments. I also discussed with the patient that there may be a patient responsible charge related to this service. The patient expressed understanding and agreed to proceed. Subjective:  Lori Frederick is a 24 y.o. G1P0 at [redacted]w[redacted]d being seen today for ongoing prenatal care.  She is currently monitored for the following issues for this low-risk pregnancy and has Supervision of normal first pregnancy, antepartum and Obesity during pregnancy, antepartum on their problem list.  Patient reports mild SOB when lying down.  Contractions: Not present. Vag. Bleeding: None.  Movement: Present. Denies any leaking of fluid.   The following portions of the patient's history were reviewed and updated as appropriate: allergies, current medications, past family history, past medical history, past social history, past surgical history and problem list.   Objective:   Vitals:   10/18/19 1353  BP: (!) 108/53  Pulse: 76  Weight: 187 lb (84.8 kg)    Fetal Status:     Movement: Present     General:  Alert, oriented and cooperative. Patient is in no acute distress.  Respiratory: Normal respiratory effort, no problems with respiration noted  Mental Status: Normal mood and affect. Normal behavior. Normal judgment and thought content.  Rest of physical exam deferred due to type of encounter  Imaging: [place] MFM OB DETAIL +14 WK  Result Date: 09/27/2019 ----------------------------------------------------------------------  OBSTETRICS REPORT                        (Signed Final 09/27/2019 12:12 pm) ---------------------------------------------------------------------- Patient Info  ID #:       [id number]                          D.O.B.:  04-Dec-1995 (24 yrs)  Name:       Lori Frederick               Visit Date: 09/27/2019 10:44 am ---------------------------------------------------------------------- Performed By  Attending:        Noralee Space MD        Ref. Address:     [address] [place]  Performed By:     Percell Boston          [facility]  Referred By:      [facility] ---------------------------------------------------------------------- Orders  #  Description                           Code        Ordered By  1  US MFM OB DETAIL +14 WK               [id number]    Steward DroneVERONICA ROGERS ----------------------------------------------------------------------  #  Order #                     Accession #                Episode #  1  [id number]                   [id number]                 [id number] ---------------------------------------------------------------------- Indications  [redacted] weeks gestation of pregnancy                Z3A.19  Obesity complicating pregnancy, second         O99.212  trimester (pregravid bmi 32.2)  Encounter for antenatal screening for          Z36.3  malformations  Low risk NIPS, 5.6FF, neg Horizon ---------------------------------------------------------------------- Fetal Evaluation  Num Of Fetuses:         1  Fetal Heart Rate(bpm):  135  Cardiac Activity:       Observed  Presentation:           Breech  Placenta:               Anterior  P. Cord Insertion:      Visualized, central  Amniotic Fluid  AFI FV:      Within normal limits                              Largest Pocket(cm)                              4.4 ----------------------------------------------------------------------  Biometry  BPD:      45.5  mm     G. Age:  19w 5d         70  %    CI:        71.96   %    70 - 86                                                          FL/HC:      17.3   %    16.1 - 18.3  HC:      170.7  mm     G. Age:  19w 5d         61  %    HC/AC:      1.13        1.09 - 1.39  AC:      150.8  mm     G. Age:  20w 2d         78  %    FL/BPD:     65.1   %  FL:       29.6  mm  G. Age:  19w 1d         37  %    FL/AC:      19.6   %    20 - 24  HUM:      30.3  mm     G. Age:  20w 0d         69  %  CER:      20.4  mm     G. Age:  19w 4d         62  %  NFT:       2.6  mm  CM:        4.6  mm  Est. FW:     311  gm    0 lb 11 oz      73  % ---------------------------------------------------------------------- OB History  Gravidity:    1         Term:   0        Prem:   0        SAB:   0  TOP:          0       Ectopic:  0        Living: 0 ---------------------------------------------------------------------- Gestational Age  LMP:           19w 2d        Date:  05/15/19                 EDD:   02/19/20  U/S Today:     19w 5d                                        EDD:   02/16/20  Best:          19w 2d     Det. By:  LMP  (05/15/19)          EDD:   02/19/20 ---------------------------------------------------------------------- Anatomy  Cranium:               Appears normal         LVOT:                   Appears normal  Cavum:                 Appears normal         Aortic Arch:            Appears normal  Ventricles:            Appears normal         Ductal Arch:            Appears normal  Choroid Plexus:        Appears normal         Diaphragm:              Appears normal  Cerebellum:            Appears normal         Stomach:                Appears normal, left  sided  Posterior Fossa:       Appears normal         Abdomen:                Appears normal  Nuchal Fold:           Appears normal         Abdominal Wall:         Not well visualized  Face:                   Appears normal         Cord Vessels:           Appears normal (3                         (orbits and profile)                           vessel cord)  Lips:                  Appears normal         Kidneys:                Appear normal  Palate:                Not well visualized    Bladder:                Appears normal  Thoracic:              Appears normal         Spine:                  Appears normal  Heart:                 Appears normal         Upper Extremities:      Appears normal                         (4CH, axis, and                         situs)  RVOT:                  Appears normal         Lower Extremities:      Appears normal  Other:  Fetus appears to be female. Heels and 5th digit visualized.          Technically difficult due to fetal position. ---------------------------------------------------------------------- Cervix Uterus Adnexa  Cervix  Length:            3.2  cm.  Normal appearance by transabdominal scan.  Uterus  No abnormality visualized.  Right Ovary  No adnexal mass visualized.  Left Ovary  No adnexal mass visualized.  Cul De Sac  No free fluid seen.  Adnexa  No abnormality visualized. ---------------------------------------------------------------------- Impression  We performed fetal anatomy scan. No makers of  aneuploidies or fetal structural defects are seen. Fetal  biometry is consistent with her previously-established dates.  Amniotic fluid is normal and good fetal activity is seen.  Patient understands the limitations of ultrasound in detecting  fetal anomalies.  On cell-free fetal DNA screening, the risks of fetal  aneuploidies are not increased . ---------------------------------------------------------------------- Recommendations  -An appointment was  made for her to return in 4 weeks for  completion of fetal anatomy (revisit 4-chamber view and  RVOT). ----------------------------------------------------------------------                  Noralee Space, MD  Electronically Signed Final Report   09/27/2019 12:12 pm ----------------------------------------------------------------------   Assessment and Plan:  Pregnancy: G1P0 at [redacted]w[redacted]d 1. Supervision of normal first pregnancy, antepartum --Anticipatory guidance about next visits/weeks of pregnancy given. --Next visit in 5 weeks in office for glucose test  2. Shortness of breath due to pregnancy in second trimester --Pt reports sensation, not pain, in her chest sometimes when lying down.  She denies true shortness of breath or trying to catch her breath. It does not feel like her heart is racing. She feels like it is mild but wanted to let us know. --Likely physiologic changes of pregnancy, increased blood volume and extra work on heart.  Could be palpitations with extra blood volume.   --Pt has some heartburn, usually resolved by Tums.  Pt encouraged to use Pepcid PRN for heartburn. --Reasons to seek care reviewed  Preterm labor symptoms and general obstetric precautions including but not limited to vaginal bleeding, contractions, leaking of fluid and fetal movement were reviewed in detail with the patient. I discussed the assessment and treatment plan with the patient. The patient was provided an opportunity to ask questions and all were answered. The patient agreed with the plan and demonstrated an understanding of the instructions. The patient was advised to call back or seek an in-person office evaluation/go to MAU at [facility] for any urgent or concerning symptoms. Please refer to After Visit Summary for other counseling recommendations.   I provided 10 minutes of face-to-face time during this encounter.  No follow-ups on file.  Future Appointments  Date Time Provider Department Center  10/25/2019 10:00 AM [facility] US1 [facility]    Sharen Counter, CNM Center for [employer], [facility]

## 2019-10-25 ENCOUNTER — Other Ambulatory Visit: Payer: Self-pay

## 2019-10-25 ENCOUNTER — Ambulatory Visit: Payer: 59 | Attending: Obstetrics and Gynecology

## 2019-10-25 DIAGNOSIS — O99212 Obesity complicating pregnancy, second trimester: Secondary | ICD-10-CM

## 2019-10-25 DIAGNOSIS — Z3A23 23 weeks gestation of pregnancy: Secondary | ICD-10-CM

## 2019-10-25 DIAGNOSIS — Z362 Encounter for other antenatal screening follow-up: Secondary | ICD-10-CM | POA: Diagnosis not present

## 2019-10-25 DIAGNOSIS — E669 Obesity, unspecified: Secondary | ICD-10-CM | POA: Diagnosis not present

## 2019-10-25 DIAGNOSIS — O99210 Obesity complicating pregnancy, unspecified trimester: Secondary | ICD-10-CM | POA: Insufficient documentation

## 2019-11-22 ENCOUNTER — Ambulatory Visit (INDEPENDENT_AMBULATORY_CARE_PROVIDER_SITE_OTHER): Payer: 59 | Admitting: Advanced Practice Midwife

## 2019-11-22 ENCOUNTER — Other Ambulatory Visit: Payer: Self-pay

## 2019-11-22 VITALS — BP 114/64 | HR 86 | Wt 192.0 lb

## 2019-11-22 DIAGNOSIS — Z3A27 27 weeks gestation of pregnancy: Secondary | ICD-10-CM

## 2019-11-22 DIAGNOSIS — Z3400 Encounter for supervision of normal first pregnancy, unspecified trimester: Secondary | ICD-10-CM

## 2019-11-22 NOTE — Progress Notes (Signed)
   PRENATAL VISIT NOTE  Subjective:  Lori Frederick is a 24 y.o. G1P0 at [redacted]w[redacted]d being seen today for ongoing prenatal care.  She is currently monitored for the following issues for this low-risk pregnancy and has Supervision of normal first pregnancy, antepartum and Obesity during pregnancy, antepartum on their problem list.  Patient reports no complaints.  Contractions: Not present. Vag. Bleeding: None.  Movement: Present. Denies leaking of fluid.   The following portions of the patient's history were reviewed and updated as appropriate: allergies, current medications, past family history, past medical history, past social history, past surgical history and problem list.   Objective:   Vitals:   11/22/19 0908  BP: 114/64  Pulse: 86  Weight: 192 lb (87.1 kg)    Fetal Status:   Fundal Height: 28 cm Movement: Present     General:  Alert, oriented and cooperative. Patient is in no acute distress.  Skin: Skin is warm and dry. No rash noted.   Cardiovascular: Normal heart rate noted  Respiratory: Normal respiratory effort, no problems with respiration noted  Abdomen: Soft, gravid, appropriate for gestational age.  Pain/Pressure: Absent     Pelvic: Cervical exam deferred        Extremities: Normal range of motion.  Edema: None  Mental Status: Normal mood and affect. Normal behavior. Normal judgment and thought content.   Assessment and Plan:  Pregnancy: G1P0 at [redacted]w[redacted]d 1. [redacted] weeks gestation of pregnancy  - HIV antibody (with reflex) - CBC - RPR - 2Hr GTT w/ 1 Hr Carpenter 75 g  2. Supervision of normal first pregnancy, antepartum --Anticipatory guidance about next visits/weeks of pregnancy given. --Next visit in 4 weeks virtual  Preterm labor symptoms and general obstetric precautions including but not limited to vaginal bleeding, contractions, leaking of fluid and fetal movement were reviewed in detail with the patient. Please refer to After Visit Summary for other counseling  recommendations.   Return in about 4 weeks (around 12/20/2019).  Future Appointments  Date Time Provider Department Center  12/20/2019  9:35 AM Sharyon Cable, CNM [facility]    Sharen Counter, CNM

## 2019-11-22 NOTE — Patient Instructions (Signed)
Third Trimester of Pregnancy The third trimester is from week 28 through week 40 (months 7 through 9). The third trimester is a time when the unborn baby (fetus) is growing rapidly. At the end of the ninth month, the fetus is about 20 inches in length and weighs 6-10 pounds. Body changes during your third trimester Your body will continue to go through many changes during pregnancy. The changes vary from woman to woman. During the third trimester:  Your weight will continue to increase. You can expect to gain 25-35 pounds (11-16 kg) by the end of the pregnancy.  You may begin to get stretch marks on your hips, abdomen, and breasts.  You may urinate more often because the fetus is moving lower into your pelvis and pressing on your bladder.  You may develop or continue to have heartburn. This is caused by increased hormones that slow down muscles in the digestive tract.  You may develop or continue to have constipation because increased hormones slow digestion and cause the muscles that push waste through your intestines to relax.  You may develop hemorrhoids. These are swollen veins (varicose veins) in the rectum that can itch or be painful.  You may develop swollen, bulging veins (varicose veins) in your legs.  You may have increased body aches in the pelvis, back, or thighs. This is due to weight gain and increased hormones that are relaxing your joints.  You may have changes in your hair. These can include thickening of your hair, rapid growth, and changes in texture. Some women also have hair loss during or after pregnancy, or hair that feels dry or thin. Your hair will most likely return to normal after your baby is born.  Your breasts will continue to grow and they will continue to become tender. A yellow fluid (colostrum) may leak from your breasts. This is the first milk you are producing for your baby.  Your belly button may stick out.  You may notice more swelling in your hands,  face, or ankles.  You may have increased tingling or numbness in your hands, arms, and legs. The skin on your belly may also feel numb.  You may feel short of breath because of your expanding uterus.  You may have more problems sleeping. This can be caused by the size of your belly, increased need to urinate, and an increase in your body's metabolism.  You may notice the fetus "dropping," or moving lower in your abdomen (lightening).  You may have increased vaginal discharge.  You may notice your joints feel loose and you may have pain around your pelvic bone. What to expect at prenatal visits You will have prenatal exams every 2 weeks until week 36. Then you will have weekly prenatal exams. During a routine prenatal visit:  You will be weighed to make sure you and the baby are growing normally.  Your blood pressure will be taken.  Your abdomen will be measured to track your baby's growth.  The fetal heartbeat will be listened to.  Any test results from the previous visit will be discussed.  You may have a cervical check near your due date to see if your cervix has softened or thinned (effaced).  You will be tested for Group B streptococcus. This happens between 35 and 37 weeks. Your health care provider may ask you:  What your birth plan is.  How you are feeling.  If you are feeling the baby move.  If you have had any abnormal   symptoms, such as leaking fluid, bleeding, severe headaches, or abdominal cramping.  If you are using any tobacco products, including cigarettes, chewing tobacco, and electronic cigarettes.  If you have any questions. Other tests or screenings that may be performed during your third trimester include:  Blood tests that check for low iron levels (anemia).  Fetal testing to check the health, activity level, and growth of the fetus. Testing is done if you have certain medical conditions or if there are problems during the pregnancy.  Nonstress test  (NST). This test checks the health of your baby to make sure there are no signs of problems, such as the baby not getting enough oxygen. During this test, a belt is placed around your belly. The baby is made to move, and its heart rate is monitored during movement. What is false labor? False labor is a condition in which you feel small, irregular tightenings of the muscles in the womb (contractions) that usually go away with rest, changing position, or drinking water. These are called Braxton Hicks contractions. Contractions may last for hours, days, or even weeks before true labor sets in. If contractions come at regular intervals, become more frequent, increase in intensity, or become painful, you should see your health care provider. What are the signs of labor?  Abdominal cramps.  Regular contractions that start at 10 minutes apart and become stronger and more frequent with time.  Contractions that start on the top of the uterus and spread down to the lower abdomen and back.  Increased pelvic pressure and dull back pain.  A watery or bloody mucus discharge that comes from the vagina.  Leaking of amniotic fluid. This is also known as your "water breaking." It could be a slow trickle or a gush. Let your health care provider know if it has a color or strange odor. If you have any of these signs, call your health care provider right away, even if it is before your due date. Follow these instructions at home: Medicines  Follow your health care provider's instructions regarding medicine use. Specific medicines may be either safe or unsafe to take during pregnancy.  Take a prenatal vitamin that contains at least 600 micrograms (mcg) of folic acid.  If you develop constipation, try taking a stool softener if your health care provider approves. Eating and drinking   Eat a balanced diet that includes fresh fruits and vegetables, whole grains, good sources of protein such as meat, eggs, or tofu,  and low-fat dairy. Your health care provider will help you determine the amount of weight gain that is right for you.  Avoid raw meat and uncooked cheese. These carry germs that can cause birth defects in the baby.  If you have low calcium intake from food, talk to your health care provider about whether you should take a daily calcium supplement.  Eat four or five small meals rather than three large meals a day.  Limit foods that are high in fat and processed sugars, such as fried and sweet foods.  To prevent constipation: ? Drink enough fluid to keep your urine clear or pale yellow. ? Eat foods that are high in fiber, such as fresh fruits and vegetables, whole grains, and beans. Activity  Exercise only as directed by your health care provider. Most women can continue their usual exercise routine during pregnancy. Try to exercise for 30 minutes at least 5 days a week. Stop exercising if you experience uterine contractions.  Avoid heavy lifting.  Do   not exercise in extreme heat or humidity, or at high altitudes.  Wear low-heel, comfortable shoes.  Practice good posture.  You may continue to have sex unless your health care provider tells you otherwise. Relieving pain and discomfort  Take frequent breaks and rest with your legs elevated if you have leg cramps or low back pain.  Take warm sitz baths to soothe any pain or discomfort caused by hemorrhoids. Use hemorrhoid cream if your health care provider approves.  Wear a good support bra to prevent discomfort from breast tenderness.  If you develop varicose veins: ? Wear support pantyhose or compression stockings as told by your healthcare provider. ? Elevate your feet for 15 minutes, 3-4 times a day. Prenatal care  Write down your questions. Take them to your prenatal visits.  Keep all your prenatal visits as told by your health care provider. This is important. Safety  Wear your seat belt at all times when driving.  Make  a list of emergency phone numbers, including numbers for family, friends, the hospital, and police and fire departments. General instructions  Avoid cat litter boxes and soil used by cats. These carry germs that can cause birth defects in the baby. If you have a cat, ask someone to clean the litter box for you.  Do not travel far distances unless it is absolutely necessary and only with the approval of your health care provider.  Do not use hot tubs, steam rooms, or saunas.  Do not drink alcohol.  Do not use any products that contain nicotine or tobacco, such as cigarettes and e-cigarettes. If you need help quitting, ask your health care provider.  Do not use any medicinal herbs or unprescribed drugs. These chemicals affect the formation and growth of the baby.  Do not douche or use tampons or scented sanitary pads.  Do not cross your legs for long periods of time.  To prepare for the arrival of your baby: ? Take prenatal classes to understand, practice, and ask questions about labor and delivery. ? Make a trial run to the hospital. ? Visit the hospital and tour the maternity area. ? Arrange for maternity or paternity leave through employers. ? Arrange for family and friends to take care of pets while you are in the hospital. ? Purchase a rear-facing car seat and make sure you know how to install it in your car. ? Pack your hospital bag. ? Prepare the baby's nursery. Make sure to remove all pillows and stuffed animals from the baby's crib to prevent suffocation.  Visit your dentist if you have not gone during your pregnancy. Use a soft toothbrush to brush your teeth and be gentle when you floss. Contact a health care provider if:  You are unsure if you are in labor or if your water has broken.  You become dizzy.  You have mild pelvic cramps, pelvic pressure, or nagging pain in your abdominal area.  You have lower back pain.  You have persistent nausea, vomiting, or  diarrhea.  You have an unusual or bad smelling vaginal discharge.  You have pain when you urinate. Get help right away if:  Your water breaks before 37 weeks.  You have regular contractions less than 5 minutes apart before 37 weeks.  You have a fever.  You are leaking fluid from your vagina.  You have spotting or bleeding from your vagina.  You have severe abdominal pain or cramping.  You have rapid weight loss or weight gain.  You have   shortness of breath with chest pain.  You notice sudden or extreme swelling of your face, hands, ankles, feet, or legs.  Your baby makes fewer than 10 movements in 2 hours.  You have severe headaches that do not go away when you take medicine.  You have vision changes. Summary  The third trimester is from week 28 through week 40, months 7 through 9. The third trimester is a time when the unborn baby (fetus) is growing rapidly.  During the third trimester, your discomfort may increase as you and your baby continue to gain weight. You may have abdominal, leg, and back pain, sleeping problems, and an increased need to urinate.  During the third trimester your breasts will keep growing and they will continue to become tender. A yellow fluid (colostrum) may leak from your breasts. This is the first milk you are producing for your baby.  False labor is a condition in which you feel small, irregular tightenings of the muscles in the womb (contractions) that eventually go away. These are called Braxton Hicks contractions. Contractions may last for hours, days, or even weeks before true labor sets in.  Signs of labor can include: abdominal cramps; regular contractions that start at 10 minutes apart and become stronger and more frequent with time; watery or bloody mucus discharge that comes from the vagina; increased pelvic pressure and dull back pain; and leaking of amniotic fluid. This information is not intended to replace advice given to you by your  health care provider. Make sure you discuss any questions you have with your health care provider. Document Revised: 07/01/2018 Document Reviewed: 04/15/2016 Elsevier Patient Education  2020 [employer]  

## 2019-11-24 LAB — 2HR GTT W 1 HR, CARPENTER, 75 G
Glucose, 1 Hr, Gest: 101 mg/dL (ref 65–179)
Glucose, 2 Hr, Gest: 96 mg/dL (ref 65–152)
Glucose, Fasting, Gest: 75 mg/dL (ref 65–91)

## 2019-11-24 LAB — CBC
HCT: 31.1 % — ABNORMAL LOW (ref 35.0–45.0)
Hemoglobin: 10.6 g/dL — ABNORMAL LOW (ref 11.7–15.5)
MCH: 29.4 pg (ref 27.0–33.0)
MCHC: 34.1 g/dL (ref 32.0–36.0)
MCV: 86.4 fL (ref 80.0–100.0)
MPV: 12 fL (ref 7.5–12.5)
Platelets: 224 10*3/uL (ref 140–400)
RBC: 3.6 10*6/uL — ABNORMAL LOW (ref 3.80–5.10)
RDW: 12.3 % (ref 11.0–15.0)
WBC: 10 10*3/uL (ref 3.8–10.8)

## 2019-11-24 LAB — HIV ANTIBODY (ROUTINE TESTING W REFLEX): HIV 1&2 Ab, 4th Generation: NONREACTIVE

## 2019-11-24 LAB — RPR: RPR Ser Ql: NONREACTIVE

## 2019-12-14 ENCOUNTER — Telehealth: Payer: Self-pay

## 2019-12-14 DIAGNOSIS — O26899 Other specified pregnancy related conditions, unspecified trimester: Secondary | ICD-10-CM

## 2019-12-14 MED ORDER — FAMOTIDINE 20 MG PO TABS: ORAL_TABLET | ORAL | 0 refills | Status: DC

## 2019-12-14 NOTE — Telephone Encounter (Signed)
Pt called requesting medication for heart burn. Verbal order given by Sharen Counter, CNM for Pepcid 20mg  BID PRN. Pt is aware Rx has been sent.

## 2019-12-20 ENCOUNTER — Encounter: Payer: Self-pay | Admitting: Certified Nurse Midwife

## 2019-12-20 ENCOUNTER — Other Ambulatory Visit: Payer: Self-pay

## 2019-12-20 ENCOUNTER — Ambulatory Visit (INDEPENDENT_AMBULATORY_CARE_PROVIDER_SITE_OTHER): Payer: 59 | Admitting: Certified Nurse Midwife

## 2019-12-20 VITALS — BP 108/62 | HR 78 | Wt 198.0 lb

## 2019-12-20 DIAGNOSIS — Z3A31 31 weeks gestation of pregnancy: Secondary | ICD-10-CM

## 2019-12-20 DIAGNOSIS — Z23 Encounter for immunization: Secondary | ICD-10-CM

## 2019-12-20 DIAGNOSIS — Z3403 Encounter for supervision of normal first pregnancy, third trimester: Secondary | ICD-10-CM

## 2019-12-20 DIAGNOSIS — Z3400 Encounter for supervision of normal first pregnancy, unspecified trimester: Secondary | ICD-10-CM

## 2019-12-20 NOTE — Patient Instructions (Signed)

## 2019-12-20 NOTE — Progress Notes (Signed)
   PRENATAL VISIT NOTE  Subjective:  Lori Frederick is a 24 y.o. G1P0 at [redacted]w[redacted]d being seen today for ongoing prenatal care.  She is currently monitored for the following issues for this low-risk pregnancy and has Supervision of normal first pregnancy, antepartum and Obesity during pregnancy, antepartum on their problem list.  Patient reports no complaints.  Contractions: Not present. Vag. Bleeding: None.  Movement: Present. Denies leaking of fluid.   The following portions of the patient's history were reviewed and updated as appropriate: allergies, current medications, past family history, past medical history, past social history, past surgical history and problem list.   Objective:   Vitals:   12/20/19 0936  BP: 108/62  Pulse: 78  Weight: 198 lb (89.8 kg)    Fetal Status: Fetal Heart Rate (bpm): 137 Fundal Height: 33 cm Movement: Present     General:  Alert, oriented and cooperative. Patient is in no acute distress.  Skin: Skin is warm and dry. No rash noted.   Cardiovascular: Normal heart rate noted  Respiratory: Normal respiratory effort, no problems with respiration noted  Abdomen: Soft, gravid, appropriate for gestational age.  Pain/Pressure: Present     Pelvic: Cervical exam deferred        Extremities: Normal range of motion.  Edema: Trace  Mental Status: Normal mood and affect. Normal behavior. Normal judgment and thought content.   Assessment and Plan:  Pregnancy: G1P0 at [redacted]w[redacted]d 1. [redacted] weeks gestation of pregnancy - Tdap vaccine greater than or equal to 7yo IM  2. Supervision of normal first pregnancy, antepartum - Patient doing well, no complaints - routine prenatal care - anticipatory guidance on upcoming appointments  - educated and discussed 36 weeks swabs and GBS testing. Recommended antibiotics during labor if GBS positive  - Preterm labor precautions discussed  - Educated and discussed BH contractions, what to expect and reasons to present to [facility] for  evaluation   Preterm labor symptoms and general obstetric precautions including but not limited to vaginal bleeding, contractions, leaking of fluid and fetal movement were reviewed in detail with the patient. Please refer to After Visit Summary for other counseling recommendations.   Return in about 17 days (around 01/06/2020) for ROB.  Future Appointments  Date Time Provider Department Center  01/06/2020  9:30 AM Donette Larry, CNM [id number]    Sharyon Cable, CNM

## 2020-01-06 ENCOUNTER — Other Ambulatory Visit: Payer: Self-pay

## 2020-01-06 ENCOUNTER — Ambulatory Visit (INDEPENDENT_AMBULATORY_CARE_PROVIDER_SITE_OTHER): Payer: 59 | Admitting: Certified Nurse Midwife

## 2020-01-06 VITALS — BP 107/58 | HR 70 | Wt 199.0 lb

## 2020-01-06 DIAGNOSIS — Z3403 Encounter for supervision of normal first pregnancy, third trimester: Secondary | ICD-10-CM

## 2020-01-06 DIAGNOSIS — Z3A33 33 weeks gestation of pregnancy: Secondary | ICD-10-CM

## 2020-01-06 DIAGNOSIS — Z7189 Other specified counseling: Secondary | ICD-10-CM | POA: Diagnosis not present

## 2020-01-06 DIAGNOSIS — Z3400 Encounter for supervision of normal first pregnancy, unspecified trimester: Secondary | ICD-10-CM

## 2020-01-06 NOTE — Patient Instructions (Signed)
Coronavirus (COVID-19) and Pregnancy:  Frequently Asked Questions   How might coronavirus affect my pregnancy? The data for COVID-19 is limited, but we know that women with other coronavirus infections (such as SARS-CoV) did NOT have miscarriage or stillbirth at higher rates than the general population.  On the other hand, we know that having other respiratory viral infections during pregnancy, such as flu, has been associated with problems like low birth weight and preterm birth. Also, having a high fever early in pregnancy may increase the risk of certain birth defects.  Could I transmit coronavirus to my baby during pregnancy or delivery? Among the few case studies of infants born to mothers with COVID-19 published in peer-reviewed literature, none of the infants tested positive for the virus. And there have been no reports of mother-to-baby transmission for other coronaviruses (MERS-CoV and SARS-CoV). Also, there was no virus detected in samples of amniotic fluid or breast milk. But there have been a few reports of newborns as young as a few days old with infection, suggesting that a mother can transmit the infection to her infant through close contact after delivery.  Is it safe for me to deliver at a hospital where there have been COVID-19 cases? It should be. We know that COVID-19 is a very scary virus. The good news is that hospitals are taking great precautions to keep patients and healthcare providers safe.  According to the CDC guidelines, when a patient is even suspected to have COVID-19, they should be placed in a negative pressure room. (Think of these rooms as vacuums that suck and filter the air so it's safe for the other people in the hospital.) If there are no rooms available, these patients should be asked to wait at home until they can be accomodated safely. This should make it possible for you to deliver at the hospital without putting you or your baby at risk.  Hospitals are  also implementing stricter visiting policies to keep patients safe. It's worth calling your hospital to check if there are any new regulations to be aware of.  What plans should I make now in case the hospital system is overwhelmed when it's time for me to deliver? Every hospital is making different plans for dealing with this scenario. Talk with your doctor or midwife once you're at least [redacted] weeks pregnant. I work in [employer], [employer].   I work in [employer], [employer]. Should I ask my doctor to excuse me from work until the baby is born? Should I ask my doctor to excuse me from work until the baby is born? What if I work in a school, the travel industry, or some other high-risk setting? Healthcare facilities should take care to limit the exposure of pregnant employees to patients with confirmed or suspected COVID-19, just as they would with other infectious cases. If you continue working, be sure to follow the CDC's risk assessment and infection control guidelines.  If you work in a school, travel industry, or other high-risk setting, talk with your employer about what it's doing to protect employees and minimize infection risks. Wash your hands often.   What if my OB gets COVID-19? If your doctor or midwife tests positive for COVID-19, they will need to be quarantined until they recover and are no longer at risk of transmitting the virus. In this case, you'll be assigned to another OB in your doctor's practice (or you may choose another practitioner yourself). Ask your new OB or your doctor's office if you should  self-quarantine or be tested for the virus. (It will depend on when you last saw your provider and when that person tested positive.)  Should we hold off on trying to conceive because of COVID-19? At this time, there's no reason to hold off on trying to get pregnant, but the data we have is really limited. For example, we don't think the virus causes birth defects or increases your risk of miscarriage.  But we don't know for sure whether you could transmit COVID-19 to your baby before or during delivery. We also don't know if the virus lives in semen or can be sexually transmitted.  We have a babymoon scheduled in the next few months - should we cancel? Yes. At this time, the virus has reached more than 140 countries, and there are travel bans to [place], most of [place], and [place]. Places where large numbers of people gather are at highest risk, especially airports and cruise ships.  If you were planning travel in the [place] note that any travel setting increases your risk of exposure, and there are already many places where everyone is being asked to stay home. To see how the virus is spreading, check The [place] Times map based on CDC data.  For the most current advice to help you avoid exposure, check the CDC's COVID-19 travel page.  Will the hospital separate me from my newborn and keep the baby in quarantine? If you don't have COVID-19 and have not been exposed to the virus, the hospital will not separate you from your newborn. If you do test positive for COVID-19 or have been exposed but have no symptoms, the CDC, [employer], and the Society for Maternal-Fetal Medicine all recommend that you be separated from your baby to decrease the risk of transmission to the baby. This would last until you are no longer at risk of transmitting the virus.  This scenario would, of course, be beyond heartbreaking. Talk to the hospital, your baby's pediatrician, and your family about how to plan for care of your baby in the event that you have to be separated after delivery. And try to make sure you have the emotional support you would need to endure the sadness and stress of having to potentially wait weeks to meet your newborn.   My hospital is restricting visitors and only allowing one support person. If my support person leaves after the delivery, will they be allowed to  come back? Every hospital has different policies. Contact your hospital or labor and delivery unit a week or so before delivery to get the most up-to-date restrictions. In general, if your support person needs to leave, they would be allowed back unless they knew they were exposed to COVID-19 after leaving your company.  My mom was planning to fly here to help me care for my new baby after delivery. Should I tell her not to come? Yes. If your mom is over 60 or has any serious chronic medical conditions (such as heart disease, lung disease, or diabetes), she is at higher risk of serious illness from COVID-19 and should avoid air travel. And remember that any travel setting increases a person's risk of exposure. So, it may be risky to have her around the baby after she has been traveling. For the most current advice on traveling, check the CDC's COVID-19 travel page.  Patients that need to be seen Visits that require an exam or lab - new, 28 wks, 36 wks  Antenatal  testing patients  Problem visits  Insulin starts  Postpartum visits needing placement of contraception   OB patients that can be delayed 16 wk, 24 wk mostly  37-39 wk visits with BP monitoring availability  Possibly 32, 34 wk visits with BP monitoring, could remotely check in for one or both  Remote Visits:  If MFM sees the patient, they check BP and do fetal monitoring. Can call patient to address blood sugars or other concerns (limits contact with multiple offices at the same time) Diabetes sugar review--try to get on BabyScripts so you can view their CBGs, may adjust meds by phone  Postpartum checks that do not need placement of contraceptive including depression screening and prescriptions for OCs  See above for other remote visit/check-ins under delay  **Consider BabyScripts as way to check in, Advise about MyChart messaging, calling with issues.   Covid-19 Vaccine in Pregnancy: There are potential benefits and lack of known  risks of COVID vaccination during pregnancy and breastfeeding. There have not been any documented vaccine related injuries, deaths or birth defects to infant or mom after receiving the COVID-19 vaccine to date. Pregnant women are not at increased risk of contracting COVID-19 during pregnancy but they are at increased risk of developing severe disease and complications if COVID-19 is contracted while pregnant.

## 2020-01-06 NOTE — Progress Notes (Signed)
Subjective:  Lori Frederick is a 24 y.o. G1P0 at [redacted]w[redacted]d being seen today for ongoing prenatal care.  She is currently monitored for the following issues for this low-risk pregnancy and has Supervision of normal first pregnancy, antepartum and Obesity during pregnancy, antepartum on their problem list.  Patient reports no complaints.  Contractions: Irritability. Vag. Bleeding: None.  Movement: Present. Denies leaking of fluid.   The following portions of the patient's history were reviewed and updated as appropriate: allergies, current medications, past family history, past medical history, past social history, past surgical history and problem list. Problem list updated.  Objective:   Vitals:   01/06/20 0928  BP: (!) 107/58  Pulse: 70  Weight: 199 lb (90.3 kg)    Fetal Status: Fetal Heart Rate (bpm): 143 Fundal Height: 34 cm Movement: Present  Presentation: Vertex  General:  Alert, oriented and cooperative. Patient is in no acute distress.  Skin: Skin is warm and dry. No rash noted.   Cardiovascular: Normal heart rate noted  Respiratory: Normal respiratory effort, no problems with respiration noted  Abdomen: Soft, gravid, appropriate for gestational age. Pain/Pressure: Absent     Pelvic: Vag. Bleeding: None Vag D/C Character: Thin   Cervical exam deferred        Extremities: Normal range of motion.  Edema: Trace  Mental Status: Normal mood and affect. Normal behavior. Normal judgment and thought content.   Urinalysis:      Assessment and Plan:  Pregnancy: G1P0 at [redacted]w[redacted]d  1. [redacted] weeks gestation of pregnancy -GBS nv  2. Supervision of normal first pregnancy, antepartum   3. COVID-19 Vaccine Counseling: The patient was counseled on the potential benefits and lack of known risks of COVID vaccination, during pregnancy and breastfeeding, during today's visit. The patient's questions and concerns were addressed today, including safety of the vaccination and potential side effects as  they have been published by ACOG and [facility]. The patient has been informed that there have not been any documented vaccine related injuries, deaths or birth defects to infant or mom after receiving the COVID-19 vaccine to date. The patient has been made aware that although she is not at increased risk of contracting COVID-19 during pregnancy, she is at increased risk of developing severe disease and complications if she contracts COVID-19 while pregnant. All patient questions were addressed during our visit today. The patient is considering getting vaccinated.    Preterm labor symptoms and general obstetric precautions including but not limited to vaginal bleeding, contractions, leaking of fluid and fetal movement were reviewed in detail with the patient. Please refer to After Visit Summary for other counseling recommendations.  Return in about 3 weeks (around 01/27/2020).   Donette Larry, CNM

## 2020-01-09 ENCOUNTER — Telehealth: Payer: Self-pay

## 2020-01-09 DIAGNOSIS — O26899 Other specified pregnancy related conditions, unspecified trimester: Secondary | ICD-10-CM

## 2020-01-09 MED ORDER — FAMOTIDINE 20 MG PO TABS: ORAL_TABLET | ORAL | 0 refills | Status: DC

## 2020-01-09 NOTE — Telephone Encounter (Signed)
Pt called requesting a refill of Pepcid. Refill sent. Pt states she had mucous like discharge last night and wanted to make sure everything is okay. Pt is aware that this can be normal during pregnancy.

## 2020-01-24 ENCOUNTER — Other Ambulatory Visit: Payer: Self-pay

## 2020-01-24 ENCOUNTER — Encounter: Payer: Self-pay | Admitting: Certified Nurse Midwife

## 2020-01-24 ENCOUNTER — Other Ambulatory Visit (HOSPITAL_COMMUNITY)
Admission: RE | Admit: 2020-01-24 | Discharge: 2020-01-24 | Disposition: A | Discharge status: Home / Self Care | Payer: 59 | Source: Ambulatory Visit | Attending: Certified Nurse Midwife | Admitting: Certified Nurse Midwife

## 2020-01-24 ENCOUNTER — Ambulatory Visit (INDEPENDENT_AMBULATORY_CARE_PROVIDER_SITE_OTHER): Payer: 59 | Admitting: Certified Nurse Midwife

## 2020-01-24 VITALS — BP 111/67 | HR 88 | Wt 206.0 lb

## 2020-01-24 DIAGNOSIS — Z3400 Encounter for supervision of normal first pregnancy, unspecified trimester: Secondary | ICD-10-CM

## 2020-01-24 DIAGNOSIS — O99210 Obesity complicating pregnancy, unspecified trimester: Secondary | ICD-10-CM | POA: Insufficient documentation

## 2020-01-24 DIAGNOSIS — Z3A36 36 weeks gestation of pregnancy: Secondary | ICD-10-CM | POA: Insufficient documentation

## 2020-01-24 NOTE — Patient Instructions (Addendum)
Group B Streptococcus Test During Pregnancy Why am I having this test? Routine testing, also called screening, for group B streptococcus (GBS) is recommended for all pregnant women between the 36th and 37th week of pregnancy. GBS is a type of bacteria that can be passed from mother to baby during childbirth. Screening will help guide whether or not you will need treatment during labor and delivery to prevent complications such as:  An infection in your uterus during labor.  An infection in your uterus after delivery.  A serious infection in your baby after delivery, such as pneumonia, meningitis, or sepsis. GBS screening is not often done before 36 weeks of pregnancy unless you go into labor prematurely. What happens if I have group B streptococcus? If testing shows that you have GBS, your health care provider will recommend treatment with IV antibiotics during labor and delivery. This treatment significantly decreases the risk of complications for you and your baby. If you have a planned C-section and you have GBS, you may not need to be treated with antibiotics because GBS is usually passed to babies after labor starts and your water breaks. If you are in labor or your water breaks before your C-section, it is possible for GBS to get into your uterus and be passed to your baby, so you might need treatment. Is there a chance I may not need to be tested? You may not need to be tested for GBS if:  You have a urine test that shows GBS before 36 to 37 weeks.  You had a baby with GBS infection after a previous delivery. In these cases, you will automatically be treated for GBS during labor and delivery. What is being tested? This test is done to check if you have group B streptococcus in your vagina or rectum. What kind of sample is taken? To collect samples for this test, your health care provider will swab your vagina and rectum with a cotton swab. The sample is then sent to the lab to see if  GBS is present. What happens during the test?   You will remove your clothing from the waist down.  You will lie down on an exam table in the same position as you would for a pelvic exam.  Your health care provider will swab your vagina and rectum to collect samples for a culture test.  You will be able to go home after the test and do all your usual activities. How are the results reported? The test results are reported as positive or negative. What do the results mean?  A positive test means you are at risk for passing GBS to your baby during labor and delivery. Your health care provider will recommend that you are treated with an IV antibiotic during labor and delivery.  A negative test means you are at very low risk of passing GBS to your baby. There is still a low risk of passing GBS to your baby because sometimes test results may report that you do not have a condition when you do (false-negative result) or there is a chance that you may become infected with GBS after the test is done. You most likely will not need to be treated with an antibiotic during labor and delivery. Talk with your health care provider about what your results mean. Questions to ask your health care provider Ask your health care provider, or the department that is doing the test:  When will my results be ready?  How will I   get my results?  What are my treatment options? Summary  Routine testing (screening) for group B streptococcus (GBS) is recommended for all pregnant women between the 36th and 37th week of pregnancy.  GBS is a type of bacteria that can be passed from mother to baby during childbirth.  If testing shows that you have GBS, your health care provider will recommend that you are treated with IV antibiotics during labor and delivery. This treatment almost always prevents infection in newborns. This information is not intended to replace advice given to you by your health care provider. Make  sure you discuss any questions you have with your health care provider. Document Revised: 07/01/2018 Document Reviewed: 04/07/2018 Elsevier Patient Education  2020 [employer].  Reasons to go to [facility]:  1.  Contractions are  5 minutes apart or less, each last 1 minute, these have been going on for 1-2 hours, and you cannot walk or talk during them 2.  You have a large gush of fluid, or a trickle of fluid that will not stop and you have to wear a pad 3.  You have bleeding that is bright red, heavier than spotting--like menstrual bleeding (spotting can be normal in early labor or after a check of your cervix) 4.  You do not feel the baby moving like he/she normally does  

## 2020-01-24 NOTE — Progress Notes (Signed)
   PRENATAL VISIT NOTE  Subjective:  Lori Frederick is a 24 y.o. G1P0 at [redacted]w[redacted]d being seen today for ongoing prenatal care.  She is currently monitored for the following issues for this low-risk pregnancy and has Supervision of normal first pregnancy, antepartum and Obesity during pregnancy, antepartum on their problem list.  Patient reports occasional period cramps.  Contractions: Irritability. Vag. Bleeding: None.  Movement: Present. Denies leaking of fluid.   The following portions of the patient's history were reviewed and updated as appropriate: allergies, current medications, past family history, past medical history, past social history, past surgical history and problem list.   Objective:   Vitals:   01/24/20 1032  BP: 111/67  Pulse: 88  Weight: 206 lb (93.4 kg)    Fetal Status: Fetal Heart Rate (bpm): 141 Fundal Height: 37 cm Movement: Present  Presentation: Vertex  General:  Alert, oriented and cooperative. Patient is in no acute distress.  Skin: Skin is warm and dry. No rash noted.   Cardiovascular: Normal heart rate noted  Respiratory: Normal respiratory effort, no problems with respiration noted  Abdomen: Soft, gravid, appropriate for gestational age.  Pain/Pressure: Absent     Pelvic: Cervical exam performed in the presence of a chaperone Dilation: 2.5 Effacement (%): 60 Station: -3  Extremities: Normal range of motion.  Edema: Trace  Mental Status: Normal mood and affect. Normal behavior. Normal judgment and thought content.   Assessment and Plan:  Pregnancy: G1P0 at [redacted]w[redacted]d 1. [redacted] weeks gestation of pregnancy - Culture, beta strep (group b only) - Cervicovaginal ancillary only( [place])  2. Supervision of normal first pregnancy, antepartum - Patient doing well, reports occasional period cramps  - Routine prenatal care - anticipatory guidance on upcoming appointments - Educated and discussed use of EPO, RRT and IC for cervical ripening  - Discussed GBS  screening today and recommendation for antibiotics if positive   3. Obesity during pregnancy, antepartum - TWG 31lbs during pregnancy   Preterm labor symptoms and general obstetric precautions including but not limited to vaginal bleeding, contractions, leaking of fluid and fetal movement were reviewed in detail with the patient. Please refer to After Visit Summary for other counseling recommendations.   Return in about 2 weeks (around 02/07/2020) for ROB.  Future Appointments  Date Time Provider Department Center  02/07/2020 10:50 AM Sharyon Cable, CNM [facility]    Sharyon Cable, CNM

## 2020-01-25 LAB — CERVICOVAGINAL ANCILLARY ONLY
Chlamydia: NEGATIVE
Comment: NEGATIVE
Comment: NORMAL
Neisseria Gonorrhea: NEGATIVE

## 2020-01-27 LAB — CULTURE, BETA STREP (GROUP B ONLY)
MICRO NUMBER:: 11149462
SPECIMEN QUALITY:: ADEQUATE

## 2020-02-07 ENCOUNTER — Encounter: Payer: 59 | Admitting: Certified Nurse Midwife

## 2020-03-09 NOTE — Progress Notes (Deleted)
    Post Partum Visit Note  Lori Frederick is a 24 y.o. G1P0 female who presents for a postpartum visit. She is 6 weeks postpartum following a normal spontaneous vaginal delivery.  I have fully reviewed the prenatal and intrapartum course. The delivery was at 37 gestational weeks.  Anesthesia: epidural. Postpartum course has been unremarkable. Baby is doing well. Baby is feeding by bottle - {formula:72}. Bleeding {vag bleed:12292}. Bowel function is {normal:32111}. Bladder function is {normal:32111}. Patient {is/is not:9024} sexually active. Contraception method is {contraceptive method:5051}. Postpartum depression screening: {gen negative/positive:315881}.   The pregnancy intention screening data noted above was reviewed. Potential methods of contraception were discussed. The patient elected to proceed with {Upstream End Methods:24109}.      {Common ambulatory SmartLinks:19316}  Review of Systems {ros; complete:30496}    Objective:  LMP 05/15/2019    General:  {gen appearance:16600}   Breasts:  {breast exam:1202::"inspection negative, no nipple discharge or bleeding, no masses or nodularity palpable"}  Lungs: {lung exam:16931}  Heart:  {heart exam:5510}  Abdomen: {abdomen exam:16834}   Vulva:  {labia exam:12198}  Vagina: {vagina exam:12200}  Cervix:  {cervix exam:14595}  Corpus: {uterus exam:12215}  Adnexa:  {adnexa exam:12223}  Rectal Exam: {rectal/vaginal exam:12274}        Assessment:    *** postpartum exam. Pap smear {done:10129} at today's visit.   Plan:   Essential components of care per ACOG recommendations:  1.  Mood and well being: Patient with {gen negative/positive:315881} depression screening today. Reviewed local resources for support.  - Patient {Action; does/does not:19097} use tobacco. ***If using tobacco we discussed reduction and for recently cessation risk of relapse - hx of drug use? {yes/no:20286}  *** If yes, discussed support systems  2. Infant  care and feeding:  -Patient currently breastmilk feeding? {yes/no:20286} ***If breastmilk feeding discussed return to work and pumping. If needed, patient was provided letter for work to allow for every 2-3 hr pumping breaks, and to be granted a private location to express breastmilk and refrigerated area to store breastmilk. Reviewed importance of draining breast regularly to support lactation. -Social determinants of health (SDOH) reviewed in EPIC. No concerns***The following needs were identified***  3. Sexuality, contraception and birth spacing - Patient {DOES_DOES QMV:78469} want a pregnancy in the next year.  Desired family size is {NUMBER 1-10:22536} children.  - Reviewed forms of contraception in tiered fashion. Patient desired {PLAN CONTRACEPTION:313102} today.   - Discussed birth spacing of 18 months  4. Sleep and fatigue -Encouraged family/partner/community support of 4 hrs of uninterrupted sleep to help with mood and fatigue  5. Physical Recovery  - Discussed patients delivery*** and complications - Patient had a *** degree laceration, perineal healing reviewed. Patient expressed understanding - Patient has urinary incontinence? {yes/no:20286}*** Patient was referred to pelvic floor PT  - Patient {ACTION; IS/IS GEX:52841324} safe to resume physical and sexual activity  6.  Health Maintenance - Last pap smear done *** and was {Normal/abnormal wildcard:19619} with negative HPV. ***Mammogram  7. ***Chronic Disease - PCP follow up  Kathie Dike, CMA [facility] [employer], [facility]

## 2020-03-12 ENCOUNTER — Ambulatory Visit: Payer: Medicaid Other | Admitting: Obstetrics & Gynecology

## 2020-03-20 ENCOUNTER — Other Ambulatory Visit: Payer: Self-pay

## 2020-03-20 ENCOUNTER — Encounter: Payer: Self-pay | Admitting: Certified Nurse Midwife

## 2020-03-20 ENCOUNTER — Ambulatory Visit (INDEPENDENT_AMBULATORY_CARE_PROVIDER_SITE_OTHER): Payer: Medicaid Other | Admitting: Certified Nurse Midwife

## 2020-03-20 DIAGNOSIS — Z3009 Encounter for other general counseling and advice on contraception: Secondary | ICD-10-CM | POA: Diagnosis not present

## 2020-03-20 NOTE — Progress Notes (Signed)
Post Partum Visit Note  Lori Frederick is a 24 y.o. G1P0 female who presents for a postpartum visit. She is 7 weeks postpartum following a normal spontaneous vaginal delivery.  I have fully reviewed the prenatal and intrapartum course. The delivery was at 37 gestational weeks.  Anesthesia: epidural. Postpartum course has been unremarkable. Baby is doing well. Baby is feeding by bottle - Similac Advance. Bleeding no bleeding. Bowel function is normal. Bladder function is normal. Patient is sexually active. Contraception method is undecided. Postpartum depression screening: negative.   The pregnancy intention screening data noted above was reviewed. Potential methods of contraception were discussed. The patient elected to proceed with Unknown/Not Reported.    Edinburgh Postnatal Depression Scale - 03/20/20 1014      Edinburgh Postnatal Depression Scale:  In the Past 7 Days   I have been able to laugh and see the funny side of things. 0    I have looked forward with enjoyment to things. 0    I have blamed myself unnecessarily when things went wrong. 0    I have been anxious or worried for no good reason. 0    I have felt scared or panicky for no good reason. 0    Things have been getting on top of me. 0    I have been so unhappy that I have had difficulty sleeping. 0    I have felt sad or miserable. 0    I have been so unhappy that I have been crying. 0    The thought of harming myself has occurred to me. 0    Edinburgh Postnatal Depression Scale Total 0          The following portions of the patient's history were reviewed and updated as appropriate: allergies, current medications, past family history, past medical history, past social history, past surgical history and problem list.  Review of Systems Pertinent items are noted in HPI.    Objective:  BP (!) 98/59   Pulse 69   Ht 5\' 4"  (1.626 m)   Wt 168 lb (76.2 kg)   LMP 05/15/2019   Breastfeeding No   BMI 28.84 kg/m     General:  alert, cooperative and no distress   Breasts:  not examined  Lungs: NWOB  Heart:  Reg rate  Abdomen: not examined   Vulva:  not evaluated  Vagina: not evaluated  Cervix:  not evaluated  Corpus: not examined  Adnexa:  not evaluated  Rectal Exam: Not performed.       Neuro: grossly intact Assessment:   1. Postpartum exam   2. Birth control counseling     Plan:   Essential components of care per ACOG recommendations:  1.  Mood and well being: Patient with negative depression screening today. Reviewed local resources for support.  - Patient does not use tobacco. If using tobacco we discussed reduction and for recently cessation risk of relapse - hx of drug use? No    2. Infant care and feeding:  -Patient currently breastmilk feeding? No If breastmilk feeding discussed return to work and pumping. If needed, patient was provided letter for work to allow for every 2-3 hr pumping breaks, and to be granted a private location to express breastmilk and refrigerated area to store breastmilk. Reviewed importance of draining breast regularly to support lactation. -Social determinants of health (SDOH) reviewed in EPIC. No concerns   3. Sexuality, contraception and birth spacing - Patient does not want a pregnancy in  the next year.  Desired family size is 2 children.  - Reviewed forms of contraception in tiered fashion. Patient desired undecided today, but wants highly effective method - had unprotected IC 1 week ago, recommend abstinence or condoms until method started, would need neg pregnancy test prior to initiation - pt to review information, discuss with partner and call back with preferred method - Discussed birth spacing of 18 months  4. Sleep and fatigue -Encouraged family/partner/community support of 4 hrs of uninterrupted sleep to help with mood and fatigue  5. Physical Recovery  - Discussed patients delivery and complications - Patient had a vaginal laceration,  perineal healing reviewed. Patient expressed understanding - Patient has urinary incontinence? No   - Patient is safe to resume physical and sexual activity  6.  Health Maintenance - Last pap smear done 07/19/19 and was normal with negative HPV.  7. No Chronic Disease - PCP follow up prn  Donette Larry, CNM [facility] [employer], [facility]

## 2020-03-20 NOTE — Patient Instructions (Signed)
Contraception Choices Contraception, also called birth control, refers to methods or devices that prevent pregnancy. Hormonal methods Contraceptive implant  A contraceptive implant is a thin, plastic tube that contains a hormone. It is inserted into the upper part of the arm. It can remain in place for up to 3 years. Progestin-only injections Progestin-only injections are injections of progestin, a synthetic form of the hormone progesterone. They are given every 3 months by a health care provider. Birth control pills  Birth control pills are pills that contain hormones that prevent pregnancy. They must be taken once a day, preferably at the same time each day. Birth control patch  The birth control patch contains hormones that prevent pregnancy. It is placed on the skin and must be changed once a week for three weeks and removed on the fourth week. A prescription is needed to use this method of contraception. Vaginal ring  A vaginal ring contains hormones that prevent pregnancy. It is placed in the vagina for three weeks and removed on the fourth week. After that, the process is repeated with a new ring. A prescription is needed to use this method of contraception. Emergency contraceptive Emergency contraceptives prevent pregnancy after unprotected sex. They come in pill form and can be taken up to 5 days after sex. They work best the sooner they are taken after having sex. Most emergency contraceptives are available without a prescription. This method should not be used as your only form of birth control. Barrier methods Female condom  A female condom is a thin sheath that is worn over the penis during sex. Condoms keep sperm from going inside a woman's body. They can be used with a spermicide to increase their effectiveness. They should be disposed after a single use. Female condom  A female condom is a soft, loose-fitting sheath that is put into the vagina before sex. The condom keeps sperm  from going inside a woman's body. They should be disposed after a single use. Diaphragm  A diaphragm is a soft, dome-shaped barrier. It is inserted into the vagina before sex, along with a spermicide. The diaphragm blocks sperm from entering the uterus, and the spermicide kills sperm. A diaphragm should be left in the vagina for 6-8 hours after sex and removed within 24 hours. A diaphragm is prescribed and fitted by a health care provider. A diaphragm should be replaced every 1-2 years, after giving birth, after gaining more than 15 lb (6.8 kg), and after pelvic surgery. Cervical cap  A cervical cap is a round, soft latex or plastic cup that fits over the cervix. It is inserted into the vagina before sex, along with spermicide. It blocks sperm from entering the uterus. The cap should be left in place for 6-8 hours after sex and removed within 48 hours. A cervical cap must be prescribed and fitted by a health care provider. It should be replaced every 2 years. Sponge  A sponge is a soft, circular piece of polyurethane foam with spermicide on it. The sponge helps block sperm from entering the uterus, and the spermicide kills sperm. To use it, you make it wet and then insert it into the vagina. It should be inserted before sex, left in for at least 6 hours after sex, and removed and thrown away within 30 hours. Spermicides Spermicides are chemicals that kill or block sperm from entering the cervix and uterus. They can come as a cream, jelly, suppository, foam, or tablet. A spermicide should be inserted into the   vagina with an applicator at least 10-15 minutes before sex to allow time for it to work. The process must be repeated every time you have sex. Spermicides do not require a prescription. Intrauterine contraception Intrauterine device (IUD) An IUD is a T-shaped device that is put in a woman's uterus. There are two types:  Hormone IUD.This type contains progestin, a synthetic form of the hormone  progesterone. This type can stay in place for 3-5 years.  Copper IUD.This type is wrapped in copper wire. It can stay in place for 10 years.  Permanent methods of contraception Female tubal ligation In this method, a woman's fallopian tubes are sealed, tied, or blocked during surgery to prevent eggs from traveling to the uterus. Hysteroscopic sterilization In this method, a small, flexible insert is placed into each fallopian tube. The inserts cause scar tissue to form in the fallopian tubes and block them, so sperm cannot reach an egg. The procedure takes about 3 months to be effective. Another form of birth control must be used during those 3 months. Female sterilization This is a procedure to tie off the tubes that carry sperm (vasectomy). After the procedure, the man can still ejaculate fluid (semen). Natural planning methods Natural family planning In this method, a couple does not have sex on days when the woman could become pregnant. Calendar method This means keeping track of the length of each menstrual cycle, identifying the days when pregnancy can happen, and not having sex on those days. Ovulation method In this method, a couple avoids sex during ovulation. Symptothermal method This method involves not having sex during ovulation. The woman typically checks for ovulation by watching changes in her temperature and in the consistency of cervical mucus. Post-ovulation method In this method, a couple waits to have sex until after ovulation. Summary  Contraception, also called birth control, means methods or devices that prevent pregnancy.  Hormonal methods of contraception include implants, injections, pills, patches, vaginal rings, and emergency contraceptives.  Barrier methods of contraception can include female condoms, female condoms, diaphragms, cervical caps, sponges, and spermicides.  There are two types of IUDs (intrauterine devices). An IUD can be put in a woman's uterus to  prevent pregnancy for 3-5 years.  Permanent sterilization can be done through a procedure for males, females, or both.  Natural family planning methods involve not having sex on days when the woman could become pregnant. This information is not intended to replace advice given to you by your health care provider. Make sure you discuss any questions you have with your health care provider. Document Revised: 03/12/2017 Document Reviewed: 04/12/2016 Elsevier Patient Education  2020 [employer]vier Inc.  

## 2020-05-11 ENCOUNTER — Other Ambulatory Visit: Payer: Self-pay

## 2020-05-11 ENCOUNTER — Other Ambulatory Visit (HOSPITAL_COMMUNITY)
Admission: RE | Admit: 2020-05-11 | Discharge: 2020-05-11 | Disposition: A | Discharge status: Home / Self Care | Payer: Medicaid Other | Source: Ambulatory Visit | Attending: Certified Nurse Midwife | Admitting: Certified Nurse Midwife

## 2020-05-11 ENCOUNTER — Encounter: Payer: Self-pay | Admitting: Certified Nurse Midwife

## 2020-05-11 ENCOUNTER — Ambulatory Visit: Payer: Medicaid Other | Admitting: Certified Nurse Midwife

## 2020-05-11 VITALS — BP 100/49 | HR 61 | Resp 16 | Ht 64.0 in | Wt 163.0 lb

## 2020-05-11 DIAGNOSIS — N898 Other specified noninflammatory disorders of vagina: Secondary | ICD-10-CM | POA: Insufficient documentation

## 2020-05-11 MED ORDER — FLUCONAZOLE 150 MG PO TABS: 150.0000 mg | ORAL_TABLET | Freq: Once | ORAL | 1 refills | Status: AC

## 2020-05-11 NOTE — Progress Notes (Signed)
History:  Lori Frederick is a 25 y.o. G1P1001 who presents to clinic today for vaginal discharge. Patient report white thick discharge with irritation that started occurring 2 weeks ago. Denies any other problems or concerns at this time. Patient is currently not breastfeeding.    The following portions of the patient's history were reviewed and updated as appropriate: allergies, current medications, family history, past medical history, social history, past surgical history and problem list.  Review of Systems:  Review of Systems  Constitutional: Negative.   Respiratory: Negative.   Cardiovascular: Negative.   Gastrointestinal: Negative.   Genitourinary:       Vaginal discharge  Neurological: Negative.   Psychiatric/Behavioral: Negative.       Objective:  Physical Exam BP (!) 100/49   Pulse 61   Resp 16   Ht 5\' 4"  (1.626 m)   Wt 163 lb (73.9 kg)   LMP 04/25/2020   Breastfeeding No   BMI 27.98 kg/m  Physical Exam Vitals reviewed.  Cardiovascular:     Rate and Rhythm: Normal rate and regular rhythm.  Pulmonary:     Effort: Pulmonary effort is normal. No respiratory distress.     Breath sounds: Normal breath sounds. No wheezing.  Abdominal:     Palpations: Abdomen is soft. There is no mass.     Tenderness: There is no abdominal tenderness. There is no guarding.  Genitourinary:    Vagina: Vaginal discharge present.     Comments: White thick discharge noted on introitus, blind swabs collected Musculoskeletal:     Right lower leg: No edema.     Left lower leg: No edema.  Skin:    General: Skin is warm and dry.  Neurological:     Mental Status: She is alert and oriented to person, place, and time.  Psychiatric:        Mood and Affect: Mood normal.        Behavior: Behavior normal.        Thought Content: Thought content normal.    Assessment & Plan:  1. Vaginal discharge - Will treat with diflucan prior to results, with manage accordingly if any other  treatment is needed once resulted  - Cervicovaginal ancillary only( [place]) - fluconazole (DIFLUCAN) 150 MG tablet; Take 1 tablet (150 mg total) by mouth once for 1 dose. If still having symptoms in 72 hours then take second dose.  Dispense: 1 tablet; Refill: 1   06/23/2020, Sharyon Cable 05/11/2020 10:08 AM

## 2020-05-14 LAB — CERVICOVAGINAL ANCILLARY ONLY
Bacterial Vaginitis (gardnerella): POSITIVE — AB
Candida Glabrata: NEGATIVE
Candida Vaginitis: POSITIVE — AB
Chlamydia: NEGATIVE
Comment: NEGATIVE
Comment: NEGATIVE
Comment: NEGATIVE
Comment: NEGATIVE
Comment: NEGATIVE
Comment: NORMAL
Neisseria Gonorrhea: NEGATIVE
Trichomonas: NEGATIVE

## 2020-12-29 ENCOUNTER — Other Ambulatory Visit: Payer: Self-pay

## 2020-12-29 ENCOUNTER — Emergency Department
Admission: RE | Admit: 2020-12-29 | Discharge: 2020-12-29 | Disposition: A | Discharge status: Home / Self Care | Payer: Medicaid Other | Source: Ambulatory Visit

## 2020-12-29 VITALS — BP 118/63 | HR 77 | Temp 98.3°F | Resp 17

## 2020-12-29 DIAGNOSIS — R491 Aphonia: Secondary | ICD-10-CM | POA: Diagnosis not present

## 2020-12-29 DIAGNOSIS — J309 Allergic rhinitis, unspecified: Secondary | ICD-10-CM | POA: Diagnosis not present

## 2020-12-29 MED ORDER — FEXOFENADINE HCL 180 MG PO TABS: 180.0000 mg | ORAL_TABLET | Freq: Every day | ORAL | 0 refills | Status: DC

## 2020-12-29 MED ORDER — PREDNISONE 20 MG PO TABS
ORAL_TABLET | ORAL | 0 refills | Status: DC
Start: 2020-12-29 — End: 2021-06-03

## 2020-12-29 NOTE — Discharge Instructions (Addendum)
Advised patient to take medication as directed with food to completion.  Advised patient to take Allegra daily for the next 5 days for concurrent postnasal drainage/drip, then as needed going forward.  Encouraged patient to increase daily water intake while taking these medications.

## 2020-12-29 NOTE — ED Provider Notes (Signed)
[facility]    CSN: [id number] Arrival date & time: 12/29/20  1248      History   Chief Complaint Chief Complaint  Patient presents with   Sore Throat   Hoarse    HPI Rosmery Ruddell is a 25 y.o. female.   HPI 25 year old female presents with sore throat and hoarseness for 4 days.  Patient reports was cleaning with bleach due to having dog with parvo and got sore throat afterwards.  Additionally, patient reports on and off postnasal drainage/drip.  Past Medical History:  Diagnosis Date   Headache    Heart murmur     There are no problems to display for this patient.   Past Surgical History:  Procedure Laterality Date   TONSILLECTOMY      OB History     Gravida  1   Para  1   Term  1   Preterm      AB      Living  1      SAB      IAB      Ectopic      Multiple      Live Births  1            Home Medications    Prior to Admission medications   Medication Sig Start Date End Date Taking? Authorizing Provider  fexofenadine (ALLEGRA ALLERGY) 180 MG tablet Take 1 tablet (180 mg total) by mouth daily for 15 days. 12/29/20 01/13/21 Yes Trevor Iha, FNP  predniSONE (DELTASONE) 20 MG tablet Take 3 tabs PO x 5 days. 12/29/20  Yes Trevor Iha, FNP    Family History Family History  Adopted: Yes    Social History Social History   Tobacco Use   Smoking status: Former   Smokeless tobacco: Former  Building services engineer Use: Former  Substance Use Topics   Alcohol use: Not Currently   Drug use: Never     Allergies   Ibuprofen   Review of Systems Review of Systems  HENT:  Positive for postnasal drip and sore throat.   All other systems reviewed and are negative.   Physical Exam Triage Vital Signs ED Triage Vitals  Enc Vitals Group     BP 12/29/20 1304 118/63     Pulse Rate 12/29/20 1304 77     Resp 12/29/20 1304 17     Temp 12/29/20 1304 98.3 F (36.8 C)     Temp Source 12/29/20 1304 Oral     SpO2 12/29/20  1304 100 %     Weight --      Height --      Head Circumference --      Peak Flow --      Pain Score 12/29/20 1307 0     Pain Loc --      Pain Edu? --      Excl. in GC? --    No data found.  Updated Vital Signs BP 118/63 (BP Location: Left Arm)   Pulse 77   Temp 98.3 F (36.8 C) (Oral)   Resp 17   LMP 12/03/2020 (Exact Date)   SpO2 100%      Physical Exam Vitals and nursing note reviewed.  Constitutional:      General: She is not in acute distress.    Appearance: She is well-developed. She is obese. She is not ill-appearing.  HENT:     Head: Normocephalic and atraumatic.     Right Ear: Tympanic membrane  and ear canal normal.     Left Ear: Tympanic membrane and ear canal normal.     Mouth/Throat:     Mouth: Mucous membranes are moist. No oral lesions.     Pharynx: Oropharynx is clear. Uvula midline. Posterior oropharyngeal erythema present. No pharyngeal swelling, oropharyngeal exudate or uvula swelling.     Tonsils: No tonsillar exudate or tonsillar abscesses.  Eyes:     Conjunctiva/sclera: Conjunctivae normal.     Pupils: Pupils are equal, round, and reactive to light.  Cardiovascular:     Rate and Rhythm: Normal rate and regular rhythm.     Heart sounds: Normal heart sounds.  Pulmonary:     Effort: Pulmonary effort is normal.     Breath sounds: Normal breath sounds.  Musculoskeletal:     Cervical back: Normal range of motion and neck supple.  Skin:    General: Skin is warm and dry.  Neurological:     General: No focal deficit present.     Mental Status: She is alert and oriented to person, place, and time.  Psychiatric:        Mood and Affect: Mood normal.        Behavior: Behavior normal.     UC Treatments / Results  Labs (all labs ordered are listed, but only abnormal results are displayed) Labs Reviewed - No data to display  EKG   Radiology No results found.  Procedures Procedures (including critical care time)  Medications Ordered in  UC Medications - No data to display  Initial Impression / Assessment and Plan / UC Course  I have reviewed the triage vital signs and the nursing notes.  Pertinent labs & imaging results that were available during my care of the patient were reviewed by me and considered in my medical decision making (see chart for details).     MDM: 1.  Loss of voice-Rx'd Prednisone burst; 2.  Allergic rhinitis-Rx'd Allegra. Advised patient to take medication as directed with food to completion.  Advised patient to take Allegra daily for the next 5 days for concurrent postnasal drainage/drip, then as needed going forward.  Encouraged patient to increase daily water intake while taking these medications.  Patient discharged home, hemodynamically stable. Final Clinical Impressions(s) / UC Diagnoses   Final diagnoses:  Loss of voice  Allergic rhinitis, unspecified seasonality, unspecified trigger     Discharge Instructions      Advised patient to take medication as directed with food to completion.  Advised patient to take Allegra daily for the next 5 days for concurrent postnasal drainage/drip, then as needed going forward.  Encouraged patient to increase daily water intake while taking these medications.     ED Prescriptions     Medication Sig Dispense Auth. Provider   predniSONE (DELTASONE) 20 MG tablet Take 3 tabs PO x 5 days. 15 tablet Trevor Iha, FNP   fexofenadine [facility] ALLERGY) 180 MG tablet Take 1 tablet (180 mg total) by mouth daily for 15 days. 15 tablet Trevor Iha, FNP      PDMP not reviewed this encounter.   Trevor Iha, FNP 12/29/20 1401

## 2020-12-29 NOTE — ED Triage Notes (Signed)
Pt here c/o sore throat and hoarseness since 10/4. Says last week she was cleaning with bleach (due to dog having parvo) and got sore throat after. Having some post nasal drip. Tylenol Cold/Flu prn. Denies fever.

## 2021-01-27 IMAGING — US US MFM OB FOLLOW-UP
1 series · 14 of 28 positions shown · non-contrast
Comparison: none

[Series 1: us mfm ob follow-up · 76 acquisitions, 14 frames shown]
[im 3/76]
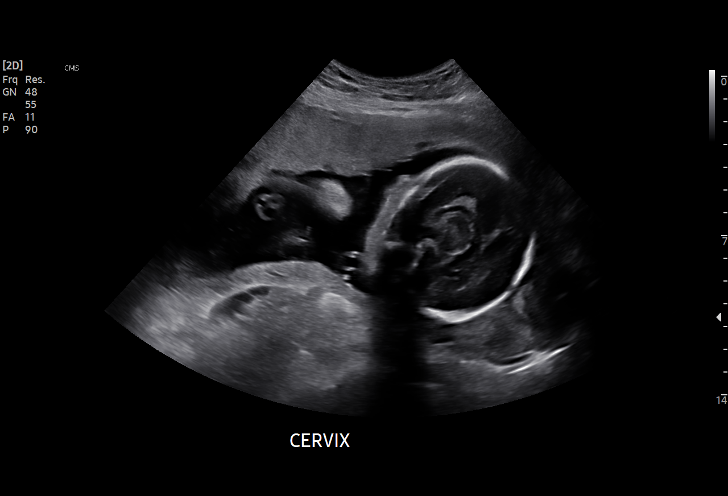
[im 9/76]
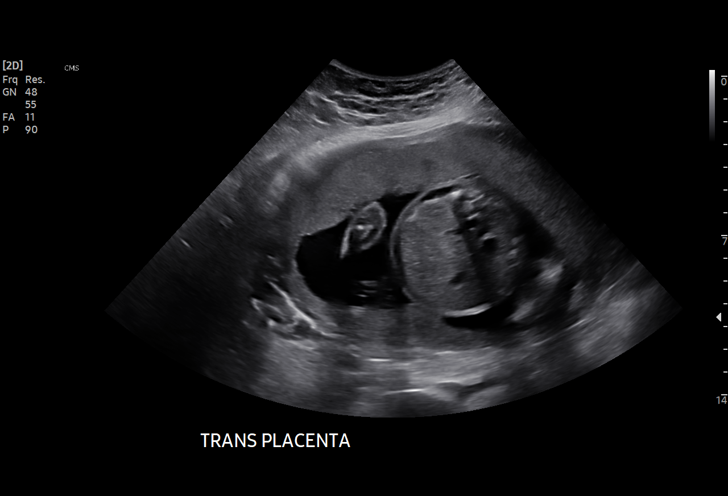
[im 14/76]
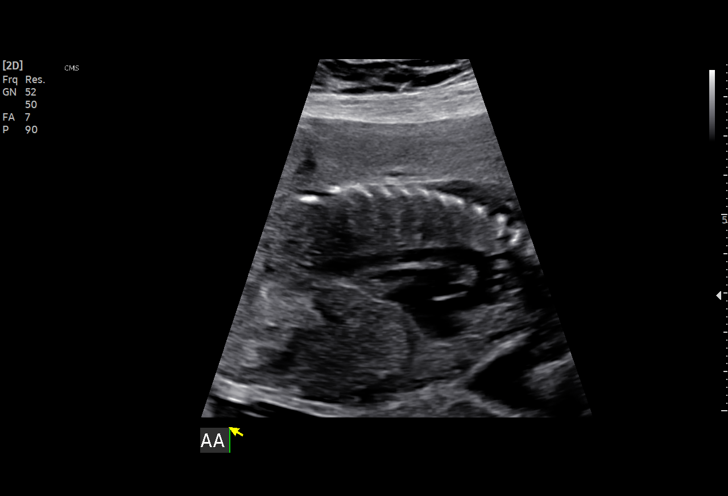
[im 20/76]
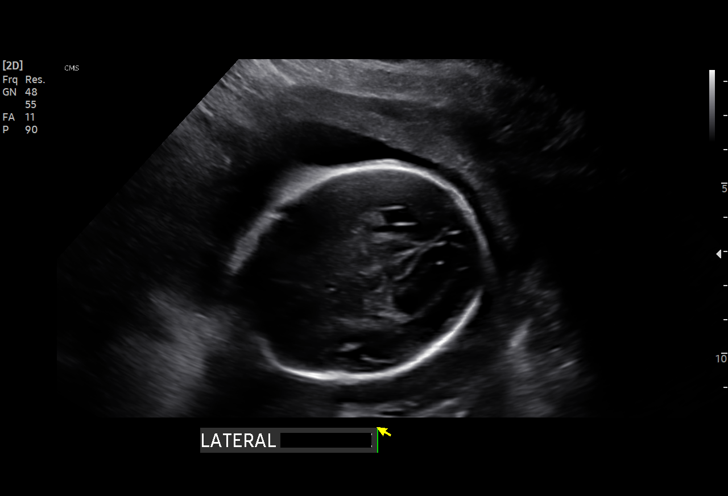
[im 26/76]
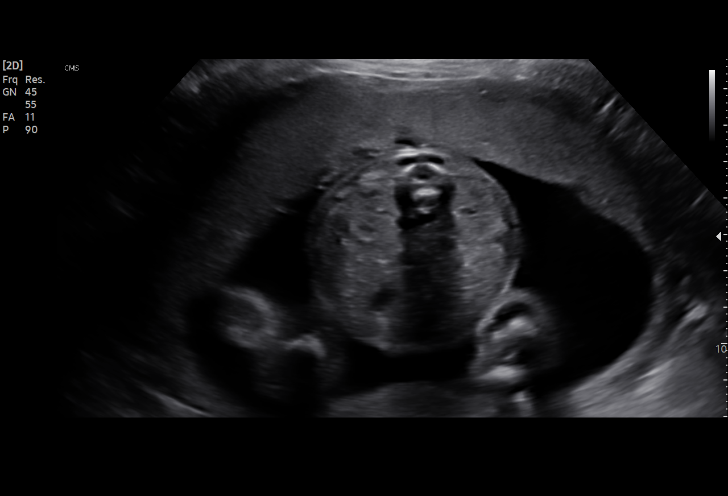
[im 31/76]
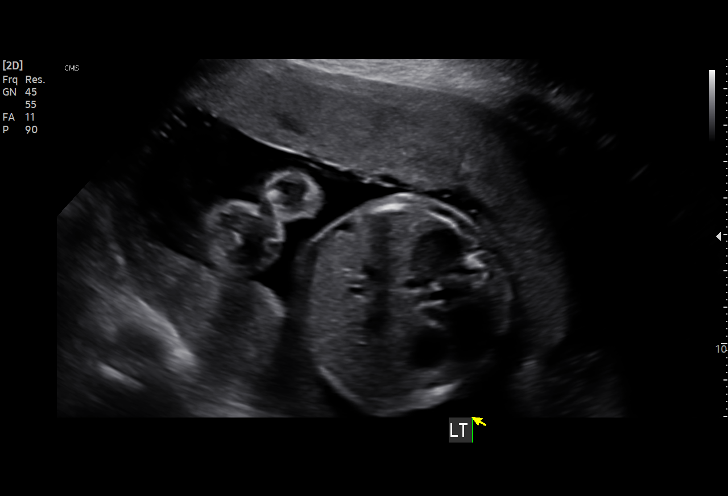
[im 37/76]
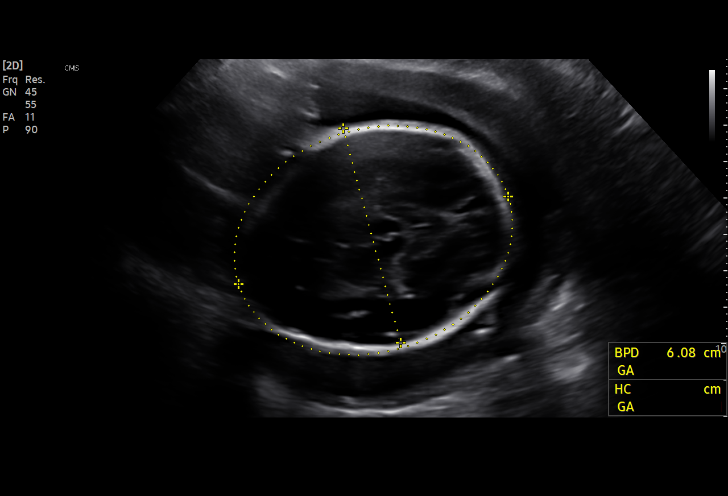
[im 42/76]
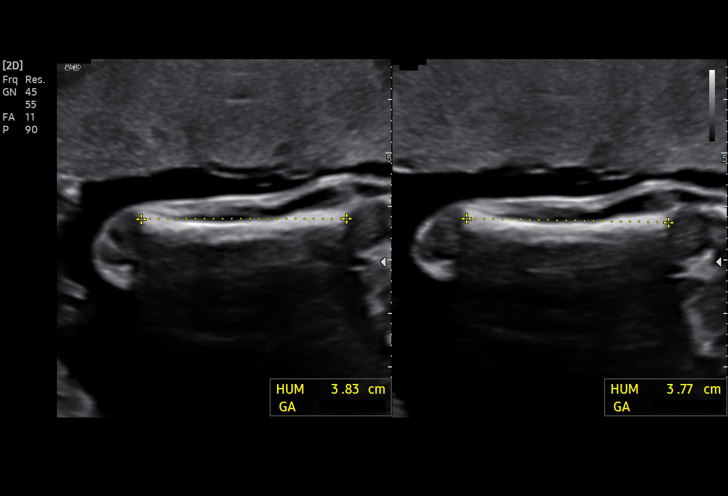
[im 48/76]
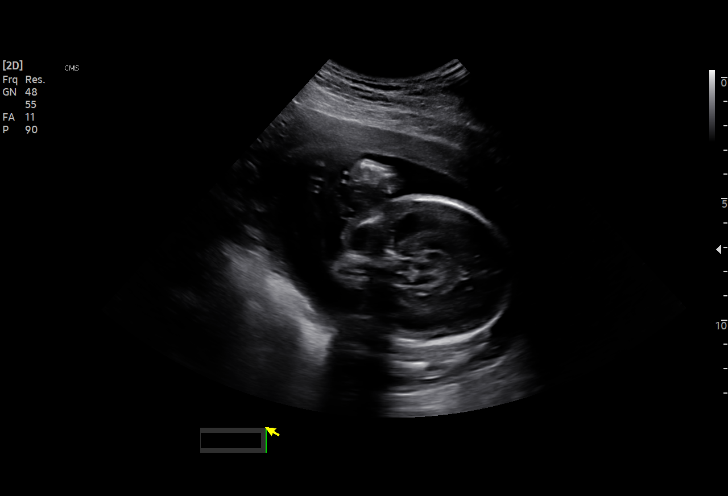
[im 53/76]
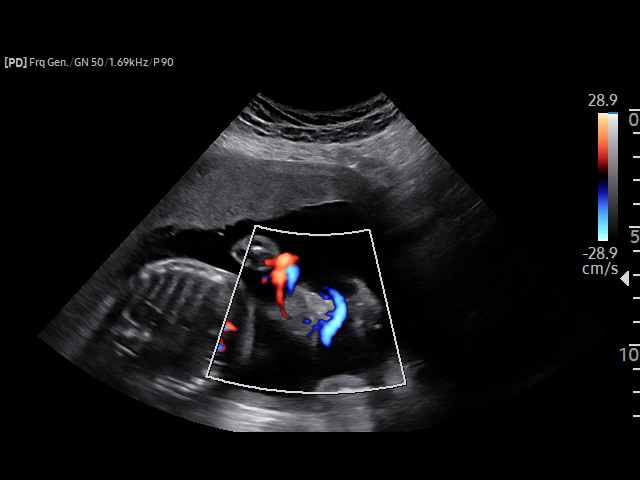
[im 59/76]
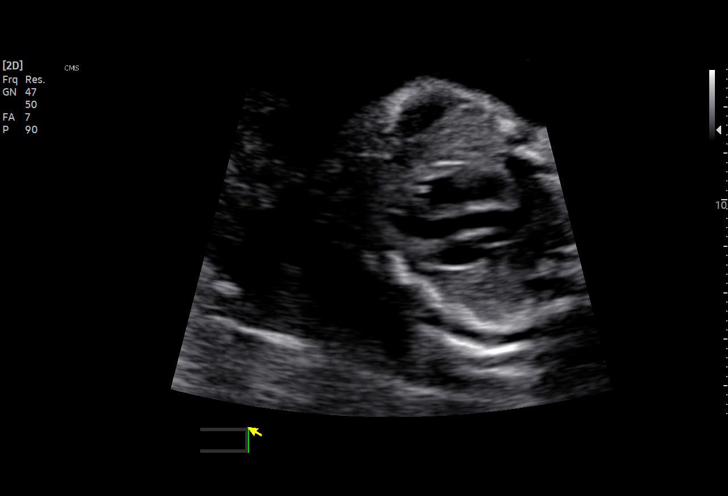
[im 64/76]
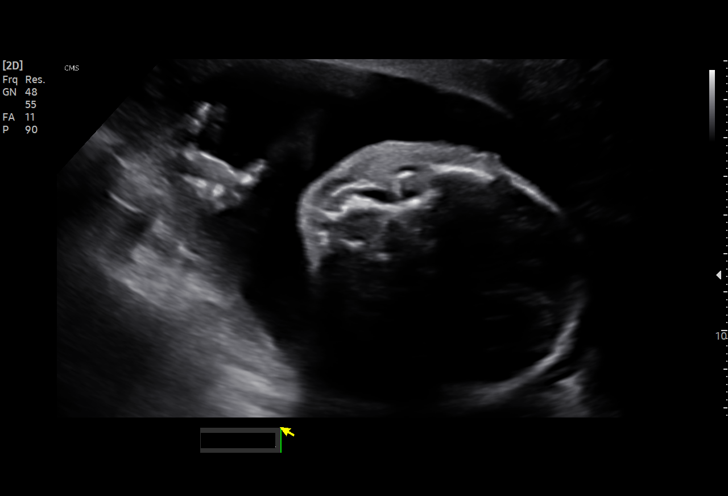
[im 70/76]
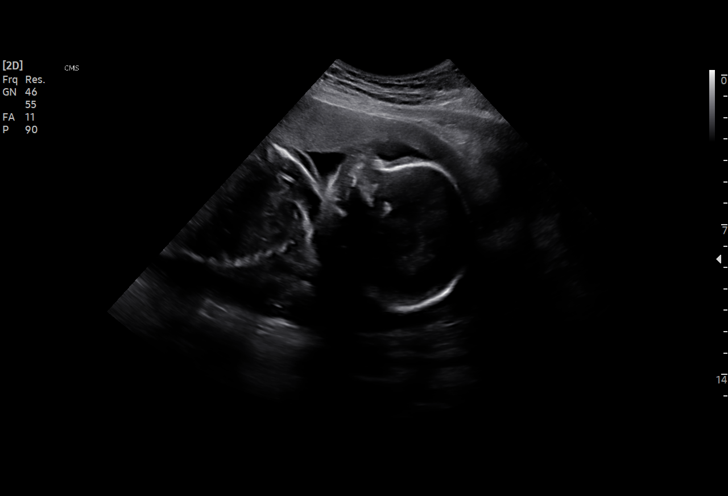
[im 76/76]
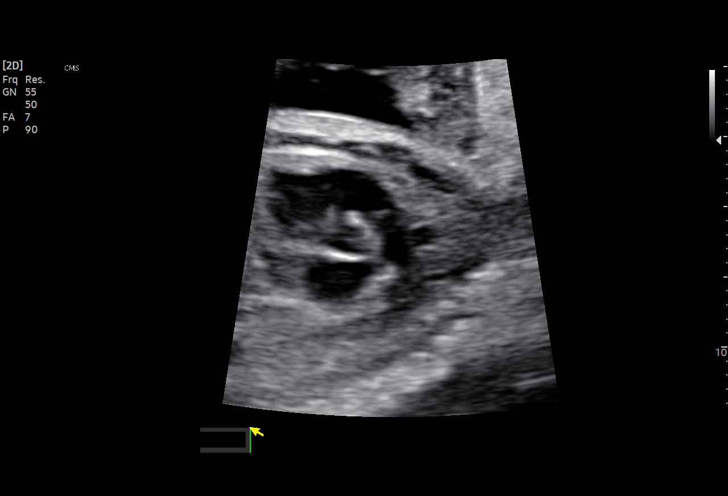

[14 of 28 positions shown; findings below may reference images not displayed]

Indications

 Antenatal follow-up for nonvisualized fetal
 anatomy
 Obesity complicating pregnancy, second
 trimester (pregravid bmi 32.2)
 Low risk NIPS, F.SII, neg Horizon
 23 weeks gestation of pregnancy
Fetal Evaluation

 Num Of Fetuses:         1
 Fetal Heart Rate(bpm):  141
 Cardiac Activity:       Observed
 Presentation:           Cephalic
 Placenta:               Anterior
 P. Cord Insertion:      Previously Visualized

 Amniotic Fluid
 AFI FV:      Within normal limits
Biometry

 BPD:      60.8  mm     G. Age:  24w 5d         90  %    CI:        76.16   %    70 - 86
                                                         FL/HC:      18.1   %    19.2 -
 HC:      220.8  mm     G. Age:  24w 1d         67  %    HC/AC:      1.16        1.05 -
 AC:      189.8  mm     G. Age:  23w 5d         56  %    FL/BPD:     65.8   %    71 - 87
 FL:         40  mm     G. Age:  22w 6d         26  %    FL/AC:      21.1   %    20 - 24
 HUM:        38  mm     G. Age:  23w 3d         45  %

 Est. FW:     601  gm      1 lb 5 oz     53  %
OB History
 Gravidity:    1         Term:   0        Prem:   0        SAB:   0
 TOP:          0       Ectopic:  0        Living: 0
Gestational Age

 LMP:           23w 2d        Date:  05/15/19                 EDD:   02/19/20
 U/S Today:     23w 6d                                        EDD:   02/15/20
 Best:          23w 2d     Det. By:  LMP  (05/15/19)          EDD:   02/19/20
Anatomy

 Cranium:               Appears normal         Aortic Arch:            Appears normal
 Cavum:                 Appears normal         Ductal Arch:            Appears normal
 Ventricles:            Appears normal         Diaphragm:              Appears normal
 Choroid Plexus:        Appears normal         Stomach:                Appears normal, left
                                                                       sided
 Cerebellum:            Appears normal         Abdomen:                Appears normal
 Posterior Fossa:       Appears normal         Abdominal Wall:         Appears nml (cord
                                                                       insert, abd wall)
 Nuchal Fold:           Previously seen        Cord Vessels:           Appears normal (3
                                                                       vessel cord)
 Face:                  Orbits and profile     Kidneys:                Appear normal
                        previously seen
 Lips:                  Appears normal         Bladder:                Appears normal
 Thoracic:              Appears normal         Spine:                  Previously seen
 Heart:                 Appears normal         Upper Extremities:      Previously seen
                        (4CH, axis, and
                        situs)
 RVOT:                  Appears normal         Lower Extremities:      Previously seen
 LVOT:                  Appears normal

 Other:  Fetus appears to be female. Heels and 5th digit visualized.
         Technically difficult due to fetal position.
Cervix Uterus Adnexa

 Cervix
 Normal appearance by transabdominal scan.

 Uterus
 No abnormality visualized.

 Right Ovary
 Not visualized.

 Left Ovary
 Not visualized.

 Cul De Sac
 No free fluid seen.

 Adnexa
 No abnormality visualized.
Impression

 Patient returned for completion of fetal anatomy .Amniotic
 fluid is normal and good fetal activity is seen .Fetal growth is
 appropriate for gestational age .Fetal anatomical survey was
 completed and appears normal.
Recommendations

 Follow-up scans as clinically indicated.
                 [place], Ofa

## 2021-03-24 NOTE — L&D Delivery Note (Signed)
OB/GYN Faculty Practice Delivery Note  Lori Frederick is a 26 y.o. G2P1001 s/p NSVD at [redacted]w[redacted]d. She was admitted for post-dated IOL.   ROM: 2h 10m with clear fluid GBS Status: negative Maximum Maternal Temperature: 97.9  Labor Progress: Patient arrived for IOL. She had loading dose of cyotec and then AROM. She progressed normally to complete dilation.   Delivery Date/Time: 03/14/22 @ 2151 Delivery: Called to room and patient was complete and pushing. Head delivered ROA. No nuchal cord present. Shoulder and body delivered in usual fashion. Infant with spontaneous cry, placed on mother's abdomen, dried and stimulated. Cord clamped x 2 after 1-minute delay, and cut by patient's sister. Cord blood drawn. Placenta delivered spontaneously with gentle cord traction. Fundus firm with massage and Pitocin. Labia, perineum, vagina, and cervix inspected inspected with tiny first degree perineal laceration. Not repaired.    Placenta: spontaneous/complete/intact Complications: none Lacerations: tiny, hemostatic first degree- not repaired  EBL: 254cc  Analgesia: epidural.   Postpartum Planning [x]  message to sent to schedule follow-up  [x]  vaccines UTD  Infant: Female  APGARs 8/9  weight pending  DNP, CNM  03/14/22  10:16 PM

## 2021-06-03 ENCOUNTER — Other Ambulatory Visit: Payer: Self-pay

## 2021-06-03 ENCOUNTER — Emergency Department
Admission: RE | Admit: 2021-06-03 | Discharge: 2021-06-03 | Disposition: A | Discharge status: Home / Self Care | Payer: Medicaid Other | Source: Ambulatory Visit | Attending: Family Medicine | Admitting: Family Medicine

## 2021-06-03 VITALS — BP 118/74 | HR 70 | Temp 98.2°F | Resp 17

## 2021-06-03 DIAGNOSIS — J029 Acute pharyngitis, unspecified: Secondary | ICD-10-CM

## 2021-06-03 LAB — POCT RAPID STREP A (OFFICE): Rapid Strep A Screen: NEGATIVE

## 2021-06-03 LAB — POC SARS CORONAVIRUS 2 AG -  ED: SARS Coronavirus 2 Ag: NEGATIVE

## 2021-06-03 NOTE — ED Triage Notes (Signed)
Pt here today with sore throat and fever since Thurs. Tmax 102.1 on Friday. Last recorded fever was Sat. Tylenol sinus prn. Pain currently 3/10. ?

## 2021-06-03 NOTE — ED Provider Notes (Signed)
?[facility] ? ? ? ?CSN: [id number] ?Arrival date & time: 06/03/21  1050 ? ? ?  ? ?History   ?Chief Complaint ?Chief Complaint  ?Patient presents with  ? Sore Throat  ? Fever  ? ? ?HPI ?Lori Frederick is a 26 y.o. female.  ? ?HPI ?Patient has had 4 to 5 days of fatigue, sore throat, chills.  Her 4-month-old daughter was seen last week as well.  No known exposure to strep, COVID, influenza.  Some headache but no real body aches. ? ?Past Medical History:  ?Diagnosis Date  ? Headache   ? Heart murmur   ? ? ?There are no problems to display for this patient. ? ? ?Past Surgical History:  ?Procedure Laterality Date  ? TONSILLECTOMY    ? ? ?OB History   ? ? Gravida  ?1  ? Para  ?1  ? Term  ?1  ? Preterm  ?   ? AB  ?   ? Living  ?1  ?  ? ? SAB  ?   ? IAB  ?   ? Ectopic  ?   ? Multiple  ?   ? Live Births  ?1  ?   ?  ?  ? ? ? ?Home Medications   ? ?Prior to Admission medications   ?Medication Sig Start Date End Date Taking? Authorizing Provider  ?fexofenadine (ALLEGRA ALLERGY) 180 MG tablet Take 1 tablet (180 mg total) by mouth daily for 15 days. 12/29/20 01/13/21  Trevor Iha, FNP  ? ? ?Family History ?Family History  ?Adopted: Yes  ? ? ?Social History ?Social History  ? ?Tobacco Use  ? Smoking status: Former  ? Smokeless tobacco: Former  ?Vaping Use  ? Vaping Use: Former  ?Substance Use Topics  ? Alcohol use: Not Currently  ? Drug use: Never  ? ? ? ?Allergies   ?Ibuprofen ? ? ?Review of Systems ?Review of Systems ?See HPI ? ?Physical Exam ?Triage Vital Signs ?ED Triage Vitals  ?Enc Vitals Group  ?   BP 06/03/21 1118 118/74  ?   Pulse Rate 06/03/21 1118 70  ?   Resp 06/03/21 1118 17  ?   Temp 06/03/21 1118 98.2 ?F (36.8 ?C)  ?   Temp Source 06/03/21 1118 Oral  ?   SpO2 06/03/21 1118 98 %  ?   Weight --   ?   Height --   ?   Head Circumference --   ?   Peak Flow --   ?   Pain Score 06/03/21 1120 3  ?   Pain Loc --   ?   Pain Edu? --   ?   Excl. in GC? --   ? ?No data found. ? ?Updated Vital Signs ?BP 118/74  (BP Location: Right Arm)   Pulse 70   Temp 98.2 ?F (36.8 ?C) (Oral)   Resp 17   SpO2 98%  ? ?   ? ?Physical Exam ?Constitutional:   ?   General: She is not in acute distress. ?   Appearance: She is well-developed and normal weight. She is not ill-appearing.  ?HENT:  ?   Head: Normocephalic and atraumatic.  ?   Right Ear: Tympanic membrane and ear canal normal.  ?   Left Ear: Tympanic membrane and ear canal normal.  ?   Nose: Nose normal. No congestion.  ?   Mouth/Throat:  ?   Mouth: Mucous membranes are moist.  ?   Pharynx: Posterior oropharyngeal erythema present.  ?  Comments: Tonsils are surgically absent.  Posterior pharynx is erythematous ?Eyes:  ?   Conjunctiva/sclera: Conjunctivae normal.  ?   Pupils: Pupils are equal, round, and reactive to light.  ?Cardiovascular:  ?   Rate and Rhythm: Normal rate and regular rhythm.  ?   Heart sounds: Normal heart sounds.  ?Pulmonary:  ?   Effort: Pulmonary effort is normal. No respiratory distress.  ?   Breath sounds: Normal breath sounds.  ?Abdominal:  ?   General: There is no distension.  ?   Palpations: Abdomen is soft.  ?Musculoskeletal:     ?   General: Normal range of motion.  ?   Cervical back: Normal range of motion.  ?Lymphadenopathy:  ?   Cervical: No cervical adenopathy.  ?Skin: ?   General: Skin is warm and dry.  ?Neurological:  ?   Mental Status: She is alert.  ?Psychiatric:     ?   Mood and Affect: Mood normal.  ? ? ? ?UC Treatments / Results  ?Labs ?(all labs ordered are listed, but only abnormal results are displayed) ?Labs Reviewed  ?CULTURE, GROUP A STREP  ?POC SARS CORONAVIRUS 2 AG -  ED  ?POCT RAPID STREP A (OFFICE)  ? ? ?EKG ? ? ?Radiology ?No results found. ? ?Procedures ?Procedures (including critical care time) ? ?Medications Ordered in UC ?Medications - No data to display ? ?Initial Impression / Assessment and Plan / UC Course  ?I have reviewed the triage vital signs and the nursing notes. ? ?Pertinent labs & imaging results that were  available during my care of the patient were reviewed by me and considered in my medical decision making (see chart for details). ? ?  ? ?Reviewed continued symptomatic care for viral illness ?Final Clinical Impressions(s) / UC Diagnoses  ? ?Final diagnoses:  ?Acute pharyngitis, unspecified etiology  ? ? ? ?Discharge Instructions   ? ?  ?Strep test is negative.  Culture has been sent to laboratory ?COVID test is negative ?This is an upper respiratory virus. ?May take usual over-the-counter cough and cold medicines, Tylenol or ibuprofen for pain and fever ?Return as needed ? ? ?ED Prescriptions   ?None ?  ? ?PDMP not reviewed this encounter. ?  ?Eustace Moore, MD ?06/03/21 1250 ? ?

## 2021-06-03 NOTE — Discharge Instructions (Signed)
Strep test is negative.  Culture has been sent to laboratory ?COVID test is negative ?This is an upper respiratory virus. ?May take usual over-the-counter cough and cold medicines, Tylenol or ibuprofen for pain and fever ?Return as needed ?

## 2021-06-06 LAB — CULTURE, GROUP A STREP: Strep A Culture: NEGATIVE

## 2021-08-12 ENCOUNTER — Encounter: Payer: Self-pay | Admitting: *Deleted

## 2021-08-13 ENCOUNTER — Encounter: Payer: Self-pay | Admitting: Obstetrics and Gynecology

## 2021-08-13 ENCOUNTER — Other Ambulatory Visit (HOSPITAL_COMMUNITY)
Admission: RE | Admit: 2021-08-13 | Discharge: 2021-08-13 | Disposition: A | Discharge status: Home / Self Care | Payer: Medicaid Other | Source: Ambulatory Visit | Attending: Obstetrics and Gynecology | Admitting: Obstetrics and Gynecology

## 2021-08-13 ENCOUNTER — Ambulatory Visit (INDEPENDENT_AMBULATORY_CARE_PROVIDER_SITE_OTHER): Payer: Medicaid Other | Admitting: Obstetrics and Gynecology

## 2021-08-13 VITALS — BP 124/74 | HR 80 | Wt 179.0 lb

## 2021-08-13 DIAGNOSIS — Z3A10 10 weeks gestation of pregnancy: Secondary | ICD-10-CM | POA: Diagnosis not present

## 2021-08-13 DIAGNOSIS — Z3481 Encounter for supervision of other normal pregnancy, first trimester: Secondary | ICD-10-CM

## 2021-08-13 DIAGNOSIS — Z3490 Encounter for supervision of normal pregnancy, unspecified, unspecified trimester: Secondary | ICD-10-CM

## 2021-08-13 DIAGNOSIS — Z3480 Encounter for supervision of other normal pregnancy, unspecified trimester: Secondary | ICD-10-CM | POA: Insufficient documentation

## 2021-08-13 LAB — HEPATITIS C ANTIBODY: HCV Ab: NEGATIVE

## 2021-08-13 MED ORDER — PRENATAL VITAMINS 28-0.8 MG PO TABS: 1.0000 | ORAL_TABLET | Freq: Every day | ORAL | 3 refills | Status: DC

## 2021-08-13 MED ORDER — ASPIRIN 81 MG PO TBEC: 81.0000 mg | DELAYED_RELEASE_TABLET | Freq: Every day | ORAL | 6 refills | Status: DC

## 2021-08-13 MED ORDER — ONDANSETRON HCL 8 MG PO TABS: 8.0000 mg | ORAL_TABLET | Freq: Three times a day (TID) | ORAL | 1 refills | Status: DC | PRN

## 2021-08-13 NOTE — Progress Notes (Signed)
Bedside U/S shows single IUP with FHT of 149 BPM and CRL is 35.47mm  GA [redacted]w[redacted]d.  Pt would like RX for nausea and PNV

## 2021-08-13 NOTE — Progress Notes (Signed)
History:   Lori Frederick is a 26 y.o. G2P1001 at [redacted]w[redacted]d by LMP being seen today for her first obstetrical visit.  Her obstetrical history is significant for obesity. Patient does not intend to breast feed. Pregnancy history fully reviewed. Unplanned pregnancy, however coping well. Partner is present and supportive.   Reports having a bad experience with the epidural and would like to discuss other options for pain management with this pregancy.   Patient reports: Nausea, would like something different.   HISTORY: OB History  Gravida Para Term Preterm AB Living  2 1 1  0 0 1  SAB IAB Ectopic Multiple Live Births  0 0 0 0 1    # Outcome Date GA Lbr Len/2nd Weight Sex Delivery Anes PTL Lv  2 Current           1 Term 01/29/20 [redacted]w[redacted]d  7 lb 1.2 oz (3.21 kg) F Vag-Spont   LIV    Last pap smear was done 2021 and was normal  Past Medical History:  Diagnosis Date   Headache    Heart murmur    Past Surgical History:  Procedure Laterality Date   TONSILLECTOMY     Family History  Adopted: Yes   Social History   Tobacco Use   Smoking status: Former   Smokeless tobacco: Former  2022 Use: Former  Substance Use Topics   Alcohol use: Not Currently   Drug use: Never   Allergies  Allergen Reactions   Ibuprofen Itching and Swelling   No current outpatient medications on file prior to visit.   No current facility-administered medications on file prior to visit.    Review of Systems Pertinent items noted in HPI and remainder of comprehensive ROS otherwise negative.  Indications for ASA therapy (per UpToDate) One of the following: Previous pregnancy with preeclampsia, especially early onset and with an adverse outcome No Multifetal gestation No Chronic hypertension No Type 1 or 2 diabetes mellitus No Chronic kidney disease No Autoimmune disease (antiphospholipid syndrome, systemic lupus erythematosus) No Two or more of the following: Nulliparity No Obesity  (body mass index >30 kg/m2) Yes Family history of preeclampsia in mother or sister No Age ?35 years No Sociodemographic characteristics (African American race, low socioeconomic level) No Personal risk factors (eg, previous pregnancy with low birth weight or small for gestational age infant, previous adverse pregnancy outcome [eg, stillbirth], interval >10 years between pregnancies) No  I   Physical Exam:   Vitals:   08/13/21 0807  BP: 124/74  Pulse: 80  Weight: 179 lb (81.2 kg)   Fetal Heart Rate (bpm): 149  Uterine size:   Bedside Ultrasound for FHR check: Viable intrauterine pregnancy with positive cardiac activity noted.  Patient informed that the ultrasound is considered a limited obstetric ultrasound and is not intended to be a complete ultrasound exam.  Patient also informed that the ultrasound is not being completed with the intent of assessing for fetal or placental anomalies or any pelvic abnormalities.  Explained that the purpose of today's ultrasound is to assess for fetal heart rate.  Patient acknowledges the purpose of the exam and the limitations of the study.   BP 124/74   Pulse 80   Wt 179 lb (81.2 kg)   LMP 05/31/2021   BMI 30.73 kg/m  Uterine Size: size equals dates  Pelvic Exam:    Perineum: No Hemorrhoids, Normal Perineum   Vulva: normal   Vagina:  normal mucosa, normal discharge, no palpable nodules  pH: Not done   Cervix: no bleeding following Pap, no cervical motion tenderness and no lesions   Adnexa: normal adnexa and no mass, fullness, tenderness   Bony Pelvis: Adequate   Skin: normal coloration and turgor, no rashes    Neurologic: negative   Extremities: normal strength, tone, and muscle mass   HEENT neck supple with midline trachea and thyroid without masses   Mouth/Teeth mucous membranes moist, pharynx normal without lesions   Neck supple and no masses   Cardiovascular: regular rate and rhythm, no murmurs or gallops   Respiratory:  appears  well, vitals normal, no respiratory distress, acyanotic, normal RR, neck free of mass or lymphadenopathy, chest clear, no wheezing, crepitations, rhonchi, normal symmetric air entry   Abdomen: soft, non-tender; bowel sounds normal; no masses,  no organomegaly   Urinary: urethral meatus normal    Assessment:    Pregnancy: G2P1001 Patient Active Problem List   Diagnosis Date Noted   Supervision of other normal pregnancy, antepartum 08/13/2021     Plan:    1. Encounter for supervision of normal pregnancy, antepartum, unspecified gravidity  - Obstetric panel - HIV antibody (with reflex) - Hepatitis C Antibody - Culture, OB Urine - GC/Chlamydia probe amp ([place])not at [facility] - [place] bedside; Future - Babyscripts Schedule Optimization - [place] MFM OB COMP + 14 WK; Future - RX: BASA, Zofran, prenatal vitamins   Initial labs drawn. Continue prenatal vitamins. Problem list reviewed and updated. Genetic Screening discussed, Panorama: requested. Ultrasound discussed; fetal anatomic survey: requested. Anticipatory guidance about prenatal visits given including labs, ultrasounds, and testing. Discussed usage of the Babyscripts app for more information about pregnancy, and to track blood pressures. Also discussed usage of virtual visits as additional source of managing and completing prenatal visits.   Patient was encouraged to use MyChart to review results, send requests, and have questions addressed.   The nature of [facility] with multiple MDs and Advanced Practice Providers was explained to patient; also emphasized that residents, students are part of our team. Routine obstetric precautions reviewed. Encouraged to seek out care at office or emergency room [facility] preferred) for urgent and/or emergent concerns. Return in about 4 weeks (around 09/10/2021), or 4 weeks with me if possible.     Yohanna Tow, Harolyn Rutherford, NP Faculty Practice Center for  [employer], [facility]

## 2021-08-14 LAB — GC/CHLAMYDIA PROBE AMP (~~LOC~~) NOT AT ARMC
Chlamydia: NEGATIVE
Comment: NEGATIVE
Comment: NORMAL
Neisseria Gonorrhea: NEGATIVE

## 2021-08-15 LAB — OBSTETRIC PANEL
Absolute Monocytes: 475 cells/uL (ref 200–950)
Antibody Screen: NOT DETECTED
Basophils Absolute: 44 cells/uL (ref 0–200)
Basophils Relative: 0.5 %
Eosinophils Absolute: 326 cells/uL (ref 15–500)
Eosinophils Relative: 3.7 %
HCT: 33.4 % — ABNORMAL LOW (ref 35.0–45.0)
Hemoglobin: 11.7 g/dL (ref 11.7–15.5)
Hepatitis B Surface Ag: NONREACTIVE
Lymphs Abs: 1602 cells/uL (ref 850–3900)
MCH: 29.5 pg (ref 27.0–33.0)
MCHC: 35 g/dL (ref 32.0–36.0)
MCV: 84.3 fL (ref 80.0–100.0)
MPV: 13 fL — ABNORMAL HIGH (ref 7.5–12.5)
Monocytes Relative: 5.4 %
Neutro Abs: 6354 cells/uL (ref 1500–7800)
Neutrophils Relative %: 72.2 %
Platelets: 256 10*3/uL (ref 140–400)
RBC: 3.96 10*6/uL (ref 3.80–5.10)
RDW: 13.8 % (ref 11.0–15.0)
RPR Ser Ql: NONREACTIVE
Rubella: 4.1 Index
Total Lymphocyte: 18.2 %
WBC: 8.8 10*3/uL (ref 3.8–10.8)

## 2021-08-15 LAB — HEPATITIS C ANTIBODY
Hepatitis C Ab: NONREACTIVE
SIGNAL TO CUT-OFF: 0.1 (ref <1.00)

## 2021-08-15 LAB — HIV ANTIBODY (ROUTINE TESTING W REFLEX): HIV 1&2 Ab, 4th Generation: NONREACTIVE

## 2021-08-15 LAB — URINE CULTURE, OB REFLEX

## 2021-08-15 LAB — CULTURE, OB URINE

## 2021-09-10 ENCOUNTER — Ambulatory Visit (INDEPENDENT_AMBULATORY_CARE_PROVIDER_SITE_OTHER): Payer: Medicaid Other

## 2021-09-10 VITALS — BP 123/75 | HR 75 | Wt 186.0 lb

## 2021-09-10 DIAGNOSIS — Z3A14 14 weeks gestation of pregnancy: Secondary | ICD-10-CM

## 2021-09-10 DIAGNOSIS — Z3490 Encounter for supervision of normal pregnancy, unspecified, unspecified trimester: Secondary | ICD-10-CM | POA: Diagnosis not present

## 2021-09-10 NOTE — Progress Notes (Signed)
   PRENATAL VISIT NOTE  Subjective:  Lori Frederick is a 26 y.o. G2P1001 at [redacted]w[redacted]d being seen today for ongoing prenatal care.  She is currently monitored for the following issues for this low-risk pregnancy and has Supervision of other normal pregnancy, antepartum on their problem list.  Patient reports no complaints.  Contractions: Not present. Vag. Bleeding: None.  Movement: Absent. Denies leaking of fluid.   The following portions of the patient's history were reviewed and updated as appropriate: allergies, current medications, past family history, past medical history, past social history, past surgical history and problem list.   Objective:   Vitals:   09/10/21 1452  BP: 123/75  Pulse: 75  Weight: 186 lb (84.4 kg)    Fetal Status: Fetal Heart Rate (bpm): 132   Movement: Absent     General:  Alert, oriented and cooperative. Patient is in no acute distress.  Skin: Skin is warm and dry. No rash noted.   Cardiovascular: Normal heart rate noted  Respiratory: Normal respiratory effort, no problems with respiration noted  Abdomen: Soft, gravid, appropriate for gestational age.  Pain/Pressure: Absent     Pelvic: Cervical exam deferred        Extremities: Normal range of motion.  Edema: None  Mental Status: Normal mood and affect. Normal behavior. Normal judgment and thought content.   Assessment and Plan:  Pregnancy: G2P1001 at [redacted]w[redacted]d 1. Encounter for supervision of normal pregnancy, antepartum, unspecified gravidity - Routine OB. Doing well, no concerns - Continue bASA - Anticipatory guidance for upcoming appointments provided  - Genetic Screening  2. [redacted] weeks gestation of pregnancy   Preterm labor symptoms and general obstetric precautions including but not limited to vaginal bleeding, contractions, leaking of fluid and fetal movement were reviewed in detail with the patient. Please refer to After Visit Summary for other counseling recommendations.   Return in about 4  weeks (around 10/08/2021) for LOB.  Future Appointments  Date Time Provider Department Center  10/08/2021  2:50 PM Brand Males, CNM [facility]  10/11/2021  1:00 PM [facility]1 [facility]    Brand Males, CNM

## 2021-09-23 ENCOUNTER — Encounter: Payer: Self-pay | Admitting: *Deleted

## 2021-09-25 ENCOUNTER — Encounter: Payer: Self-pay | Admitting: *Deleted

## 2021-09-25 DIAGNOSIS — Z3480 Encounter for supervision of other normal pregnancy, unspecified trimester: Secondary | ICD-10-CM

## 2021-10-08 ENCOUNTER — Ambulatory Visit (INDEPENDENT_AMBULATORY_CARE_PROVIDER_SITE_OTHER): Payer: Medicaid Other

## 2021-10-08 VITALS — BP 100/64 | HR 68 | Wt 187.0 lb

## 2021-10-08 DIAGNOSIS — Z3480 Encounter for supervision of other normal pregnancy, unspecified trimester: Secondary | ICD-10-CM

## 2021-10-08 DIAGNOSIS — Z3A18 18 weeks gestation of pregnancy: Secondary | ICD-10-CM

## 2021-10-08 DIAGNOSIS — Z3482 Encounter for supervision of other normal pregnancy, second trimester: Secondary | ICD-10-CM

## 2021-10-08 NOTE — Progress Notes (Signed)
   PRENATAL VISIT NOTE  Subjective:  Lori Frederick is a 26 y.o. G2P1001 at [redacted]w[redacted]d being seen today for ongoing prenatal care.  She is currently monitored for the following issues for this low-risk pregnancy and has Supervision of other normal pregnancy, antepartum on their problem list.  Patient reports no complaints.  Contractions: Not present. Vag. Bleeding: None, Bloody Show.  Movement: Present. Denies leaking of fluid.   The following portions of the patient's history were reviewed and updated as appropriate: allergies, current medications, past family history, past medical history, past social history, past surgical history and problem list.   Objective:   Vitals:   10/08/21 1441  BP: 100/64  Pulse: 68  Weight: 187 lb (84.8 kg)    Fetal Status: Fetal Heart Rate (bpm): 137   Movement: Present     General:  Alert, oriented and cooperative. Patient is in no acute distress.  Skin: Skin is warm and dry. No rash noted.   Cardiovascular: Normal heart rate noted  Respiratory: Normal respiratory effort, no problems with respiration noted  Abdomen: Soft, gravid, appropriate for gestational age.  Pain/Pressure: Absent     Pelvic: Cervical exam deferred        Extremities: Normal range of motion.  Edema: None  Mental Status: Normal mood and affect. Normal behavior. Normal judgment and thought content.   Assessment and Plan:  Pregnancy: G2P1001 at [redacted]w[redacted]d 1. Supervision of other normal pregnancy, antepartum - Routine OB. Doing well, no concerns - Patient desires ppIUD-Mirena at delivery for contraction. Also desires circumcision inpatient - Anatomy ultrasound scheduled for Friday - Anticipatory guidance for upcoming appointments provided  - Alpha fetoprotein, maternal   Preterm labor symptoms and general obstetric precautions including but not limited to vaginal bleeding, contractions, leaking of fluid and fetal movement were reviewed in detail with the patient. Please refer to  After Visit Summary for other counseling recommendations.   Return in about 4 weeks (around 11/05/2021) for ROB.  Future Appointments  Date Time Provider Department Center  10/11/2021  1:00 PM [facility]1 [facility]  11/04/2021  2:30 PM Leggett, Fredrich Romans, MD [facility]    Brand Males, CNM

## 2021-10-09 LAB — ALPHA FETOPROTEIN, MATERNAL
AFP MoM: 1.47
AFP, Serum: 61 ng/mL
Calc'd Gestational Age: 18.6 weeks
Maternal Wt: 187 [lb_av]
Risk for ONTD: 1
Twins-AFP: 1

## 2021-10-11 ENCOUNTER — Other Ambulatory Visit: Payer: Self-pay | Admitting: Obstetrics and Gynecology

## 2021-10-11 ENCOUNTER — Ambulatory Visit: Payer: Medicaid Other | Admitting: *Deleted

## 2021-10-11 ENCOUNTER — Ambulatory Visit: Payer: Medicaid Other | Attending: Obstetrics and Gynecology

## 2021-10-11 ENCOUNTER — Other Ambulatory Visit: Payer: Self-pay | Admitting: *Deleted

## 2021-10-11 VITALS — BP 104/53 | HR 61

## 2021-10-11 DIAGNOSIS — O321XX0 Maternal care for breech presentation, not applicable or unspecified: Secondary | ICD-10-CM | POA: Insufficient documentation

## 2021-10-11 DIAGNOSIS — O99212 Obesity complicating pregnancy, second trimester: Secondary | ICD-10-CM | POA: Diagnosis present

## 2021-10-11 DIAGNOSIS — Z3490 Encounter for supervision of normal pregnancy, unspecified, unspecified trimester: Secondary | ICD-10-CM

## 2021-10-11 DIAGNOSIS — Z3A19 19 weeks gestation of pregnancy: Secondary | ICD-10-CM | POA: Insufficient documentation

## 2021-10-11 DIAGNOSIS — Z3689 Encounter for other specified antenatal screening: Secondary | ICD-10-CM

## 2021-10-11 DIAGNOSIS — Z3480 Encounter for supervision of other normal pregnancy, unspecified trimester: Secondary | ICD-10-CM | POA: Insufficient documentation

## 2021-10-24 ENCOUNTER — Encounter: Payer: Self-pay | Admitting: Obstetrics and Gynecology

## 2021-10-24 ENCOUNTER — Ambulatory Visit (INDEPENDENT_AMBULATORY_CARE_PROVIDER_SITE_OTHER): Payer: Medicaid Other | Admitting: Obstetrics and Gynecology

## 2021-10-24 ENCOUNTER — Other Ambulatory Visit (HOSPITAL_COMMUNITY)
Admission: RE | Admit: 2021-10-24 | Discharge: 2021-10-24 | Disposition: A | Discharge status: Home / Self Care | Payer: Medicaid Other | Source: Ambulatory Visit | Attending: Obstetrics and Gynecology | Admitting: Obstetrics and Gynecology

## 2021-10-24 VITALS — BP 109/71 | HR 78 | Wt 192.0 lb

## 2021-10-24 DIAGNOSIS — N898 Other specified noninflammatory disorders of vagina: Secondary | ICD-10-CM

## 2021-10-24 DIAGNOSIS — Z3480 Encounter for supervision of other normal pregnancy, unspecified trimester: Secondary | ICD-10-CM

## 2021-10-24 NOTE — Progress Notes (Signed)
Pt denies UTI symptoms- thinks she has a yeast infection

## 2021-10-24 NOTE — Progress Notes (Signed)
   PRENATAL VISIT NOTE  Subjective:  Lori Frederick is a 26 y.o. G2P1001 at [redacted]w[redacted]d being seen today for ongoing prenatal care.  She is currently monitored for the following issues for this low-risk pregnancy and has Supervision of other normal pregnancy, antepartum and Migraine without aura on their problem list.  Patient reports vaginal irritation.  Contractions: Not present. Vag. Bleeding: None.  Movement: Present. Denies leaking of fluid.   The following portions of the patient's history were reviewed and updated as appropriate: allergies, current medications, past family history, past medical history, past social history, past surgical history and problem list.   Objective:   Vitals:   10/24/21 1416  BP: 109/71  Pulse: 78  Weight: 192 lb (87.1 kg)    Fetal Status: Fetal Heart Rate (bpm): 138   Movement: Present     General:  Alert, oriented and cooperative. Patient is in no acute distress.  Skin: Skin is warm and dry. No rash noted.   Cardiovascular: Normal heart rate noted  Respiratory: Normal respiratory effort, no problems with respiration noted  Abdomen: Soft, gravid, appropriate for gestational age.  Pain/Pressure: Absent     Pelvic: Cervical exam performed in the presence of a chaperone erythema to labia majora bilaterally with mild skin thickening.        Extremities: Normal range of motion.  Edema: None  Mental Status: Normal mood and affect. Normal behavior. Normal judgment and thought content.   Assessment and Plan:  Pregnancy: G2P1001 at [redacted]w[redacted]d 1. Vaginal irritation - Cultures done, will await results  - Unclear if vaginal infection drive vs contact dermatitis. If infection, will treat accordingly. If not infection, would do topical steroid ointment.   2. Supervision of other normal pregnancy, antepartum Anatomy done, incomplete for aortic arch. Has f/u later this month.  AFP wnl Can move next OB visit to 4 weeks from now.    Preterm labor symptoms and  general obstetric precautions including but not limited to vaginal bleeding, contractions, leaking of fluid and fetal movement were reviewed in detail with the patient. Please refer to After Visit Summary for other counseling recommendations.   No follow-ups on file.  Future Appointments  Date Time Provider Department Center  10/24/2021  2:30 PM Milas Hock, MD [facility]  11/04/2021  2:30 PM Lesly Dukes, MD [facility]  11/15/2021  3:30 PM [facility]SE [facility]  11/15/2021  3:45 PM [facility] US4 [facility]    Milas Hock, MD

## 2021-10-25 LAB — CERVICOVAGINAL ANCILLARY ONLY
Bacterial Vaginitis (gardnerella): NEGATIVE
Candida Glabrata: NEGATIVE
Candida Vaginitis: POSITIVE — AB
Comment: NEGATIVE
Comment: NEGATIVE
Comment: NEGATIVE

## 2021-11-04 ENCOUNTER — Encounter: Payer: Medicaid Other | Admitting: Obstetrics & Gynecology

## 2021-11-15 ENCOUNTER — Ambulatory Visit: Payer: Medicaid Other | Attending: Obstetrics and Gynecology

## 2021-11-15 ENCOUNTER — Ambulatory Visit: Payer: Medicaid Other | Admitting: *Deleted

## 2021-11-15 VITALS — BP 109/55 | HR 76

## 2021-11-15 DIAGNOSIS — E669 Obesity, unspecified: Secondary | ICD-10-CM

## 2021-11-15 DIAGNOSIS — Z3689 Encounter for other specified antenatal screening: Secondary | ICD-10-CM

## 2021-11-15 DIAGNOSIS — Z362 Encounter for other antenatal screening follow-up: Secondary | ICD-10-CM | POA: Insufficient documentation

## 2021-11-15 DIAGNOSIS — Z3480 Encounter for supervision of other normal pregnancy, unspecified trimester: Secondary | ICD-10-CM

## 2021-11-15 DIAGNOSIS — Z3A24 24 weeks gestation of pregnancy: Secondary | ICD-10-CM

## 2021-11-15 DIAGNOSIS — O99212 Obesity complicating pregnancy, second trimester: Secondary | ICD-10-CM

## 2021-11-18 ENCOUNTER — Other Ambulatory Visit: Payer: Self-pay | Admitting: *Deleted

## 2021-11-18 DIAGNOSIS — O99212 Obesity complicating pregnancy, second trimester: Secondary | ICD-10-CM

## 2021-11-20 ENCOUNTER — Telehealth: Payer: Self-pay

## 2021-11-20 NOTE — Telephone Encounter (Signed)
Pt called stating she went to the bathroom and when she wiped she noticed mucous discharge. Pt denies vaginal bleeding, odor or itching. Pt was told discharge can change throughout pregnancy and that this is normal. Pt was told to call back if anything changes or if she notices any bleeding. Pt does have appt in office tomorrow. Pt expressed understanding.

## 2021-11-21 ENCOUNTER — Ambulatory Visit (INDEPENDENT_AMBULATORY_CARE_PROVIDER_SITE_OTHER): Payer: Medicaid Other | Admitting: Obstetrics & Gynecology

## 2021-11-21 VITALS — BP 113/71 | HR 80 | Wt 192.0 lb

## 2021-11-21 DIAGNOSIS — Z3480 Encounter for supervision of other normal pregnancy, unspecified trimester: Secondary | ICD-10-CM

## 2021-11-21 DIAGNOSIS — Z3A24 24 weeks gestation of pregnancy: Secondary | ICD-10-CM

## 2021-11-21 DIAGNOSIS — Z3482 Encounter for supervision of other normal pregnancy, second trimester: Secondary | ICD-10-CM

## 2021-11-21 NOTE — Progress Notes (Signed)
PRENATAL VISIT NOTE  Subjective:  Lori Frederick is a 26 y.o. G2P1001 at [redacted]w[redacted]d being seen today for ongoing prenatal care.  She is currently monitored for the following issues for this low-risk pregnancy and has Supervision of other normal pregnancy, antepartum and Migraine without aura on their problem list.  Patient reports no complaints.  Contractions: Not present. Vag. Bleeding: None.  Movement: Present. Denies leaking of fluid.   The following portions of the patient's history were reviewed and updated as appropriate: allergies, current medications, past family history, past medical history, past social history, past surgical history and problem list.   Objective:   Vitals:   11/21/21 1440  BP: 113/71  Pulse: 80  Weight: 192 lb (87.1 kg)    Fetal Status: Fetal Heart Rate (bpm): 138   Movement: Present     General:  Alert, oriented and cooperative. Patient is in no acute distress.  Skin: Skin is warm and dry. No rash noted.   Cardiovascular: Normal heart rate noted  Respiratory: Normal respiratory effort, no problems with respiration noted  Abdomen: Soft, gravid, appropriate for gestational age.  Pain/Pressure: Absent     Pelvic: Cervical exam deferred        Extremities: Normal range of motion.  Edema: None  Mental Status: Normal mood and affect. Normal behavior. Normal judgment and thought content.   [place] MFM OB FOLLOW UP  Result Date: 11/15/2021 ----------------------------------------------------------------------  OBSTETRICS REPORT                       (Signed Final 11/15/2021 05:07 pm) ---------------------------------------------------------------------- Patient Info  ID #:       [id number]                          D.O.B.:  18-May-1995 (26 yrs)  Name:       [name]               Visit Date: 11/15/2021 04:12 pm ---------------------------------------------------------------------- Performed By  Attending:        Braxton Feathers DO       Ref. Address:     [address]                                                              [place], [place]                                                             [address]  Performed By:     Fayne Norrie BS,      [facility], [facility]                                [facility]  Referred By:      Duane Lope NP ---------------------------------------------------------------------- Orders  #  Description                           Code        Ordered By  1  [place] MFM OB FOLLOW UP                   [phone]    RA[facility] ----------------------------------------------------------------------  #  Order #                     Accession #                Episode #  1  [id number]                   [id number]                 [id number] ---------------------------------------------------------------------- Indications  Obesity complicating pregnancy, second         O99.212  trimester (Pre-G BMI: 30)  Antenatal follow-up for nonvisualized fetal    Z36.2  anatomy  LR-NIPS/Horizon-Neg/AFP-Neg  [redacted] weeks gestation of pregnancy                Z3A.24 ---------------------------------------------------------------------- Fetal Evaluation  Num Of Fetuses:         1  Fetal Heart Rate(bpm):  145  Cardiac Activity:       Observed  Presentation:           Cephalic  Placenta:               Posterior  P. Cord Insertion:      Visualized, central  Amniotic Fluid  AFI FV:      Within normal limits                              Largest Pocket(cm)                              6.07 ---------------------------------------------------------------------- Biometry  BPD:     58.17  mm     G. Age:  23w 6d         37  %    CI:        75.53   %    70 - 86                                                          FL/HC:      19.5   %    18.7 - 20.9  HC:    212.24   mm     G. Age:  23w  2d         11  %    HC/AC:      1.13        1.05 - 1.21  AC:    187.52   mm     G. Age:  23w 4d         27  %    FL/BPD:     71.0   %    71 - 87  FL:      41.31  mm     G. Age:  23w 3d         20  %    FL/AC:      22.0   %    20 - 24  LV:        6.9  mm  Est. FW:     598  gm      1 lb 5 oz     20  % ---------------------------------------------------------------------- OB History  Gravidity:    2         Term:   1        Prem:   0        SAB:   0  TOP:          0       Ectopic:  0        Living: 1 ---------------------------------------------------------------------- Gestational Age  LMP:           24w 0d        Date:  05/31/21                  EDD:   03/07/22  U/S Today:     23w 4d                                        EDD:   03/10/22  Best:          24w 0d     Det. By:  LMP  (05/31/21)          EDD:   03/07/22 ---------------------------------------------------------------------- Anatomy  Cranium:               Appears normal         LVOT:                   Previously seen  Cavum:                 Appears normal         Aortic Arch:            Appears normal  Ventricles:            Appears normal         Ductal Arch:            Previously seen  Choroid Plexus:        Previously seen        Diaphragm:              Appears normal  Cerebellum:            Previously seen        Stomach:                Appears normal, left  sided  Posterior Fossa:       Previously seen        Abdomen:                Appears normal  Nuchal Fold:           Previously seen        Abdominal Wall:         Appears nml (cord                                                                        insert, abd wall)  Face:                  Orbits and profile     Cord Vessels:           Previously seen                         previously seen  Lips:                  Previously seen        Kidneys:                Appear normal  Palate:                Not well visualized    Bladder:                 Appears normal  Thoracic:              Appears normal         Spine:                  Previously seen  Heart:                 Appears normal         Upper Extremities:      Previously seen                         (4CH, axis, and                         situs)  RVOT:                  Previously seen        Lower Extremities:      Previously seen  Other:  VC, 3VV and 3VTV prev. visualized. Nasal bone, lenses, maxilla,          mandible and falx prev.visualized. Heels/feet and open hands/5th          digits prev. visualized. Fetus appears to be a female. ---------------------------------------------------------------------- Cervix Uterus Adnexa  Cervix  Not visualized (advanced GA >24wks) ---------------------------------------------------------------------- Comments  Follow-up growth ultrasound at 24w 0d with EDD of  03/07/2022 dated by LMP  (05/31/21). Pregnancy  complicated by obesity (prepreg BMI 30). Aneuploidy  screening: low risk NIPS. AFP: normal.  Sonographic findings  Single intrauterine pregnancy at at  24w 0d  Observed fetal cardiac activity.  Cephalic presentation.  Interval fetal anatomy appears normal.  Fetal biometry shows the estimated fetal weight  at the 20  percentile.  Amniotic fluid volume: Within normal limits. AFI:  cm.  MVP:  6.07 cm.  Placenta is Posterior.  Recommendations  1. Serial growth ultrasounds every 4 weeks until delivery.  Disussed limitations of [place] with the patient. ----------------------------------------------------------------------                  Braxton Feathers, DO Electronically Signed Final Report   11/15/2021 05:07 pm ----------------------------------------------------------------------   Assessment and Plan:  Pregnancy: G2P1001 at [redacted]w[redacted]d 1. [redacted] weeks gestation of pregnancy 2. Supervision of other normal pregnancy, antepartum Follow up MFM scan as recommended given EFW 20%. No other concerns. Preterm labor symptoms and general obstetric precautions  including but not limited to vaginal bleeding, contractions, leaking of fluid and fetal movement were reviewed in detail with the patient. Please refer to After Visit Summary for other counseling recommendations.   Return in about 3 weeks (around 12/12/2021) for 2 hr GTT, 3rd trimester labs, TDap, OFFICE OB VISIT (MD or APP).  Future Appointments  Date Time Provider Department Center  12/13/2021  1:30 PM [facility] NURSE [facility]  12/13/2021  1:45 PM [facility] US5 [facility]    Jaynie Collins, MD

## 2021-11-21 NOTE — Patient Instructions (Signed)

## 2021-12-06 ENCOUNTER — Encounter: Payer: Medicaid Other | Admitting: Obstetrics and Gynecology

## 2021-12-09 ENCOUNTER — Telehealth: Payer: Self-pay | Admitting: *Deleted

## 2021-12-09 NOTE — Progress Notes (Unsigned)
   PRENATAL VISIT NOTE  Subjective:  Lori Frederick is a 26 y.o. G2P1001 at [redacted]w[redacted]d being seen today for ongoing prenatal care.  She is currently monitored for the following issues for this low-risk pregnancy and has Supervision of other normal pregnancy, antepartum and Migraine without aura on their problem list.  Patient reports no complaints.  Contractions: Not present. Vag. Bleeding: None.  Movement: Present. Denies leaking of fluid.   The following portions of the patient's history were reviewed and updated as appropriate: allergies, current medications, past family history, past medical history, past social history, past surgical history and problem list.   Objective:   Vitals:   12/12/21 0844  BP: 113/69  Pulse: 78  Weight: 201 lb (91.2 kg)    Fetal Status: Fetal Heart Rate (bpm): 138 Fundal Height: 27 cm Movement: Present     General:  Alert, oriented and cooperative. Patient is in no acute distress.  Skin: Skin is warm and dry. No rash noted.   Cardiovascular: Normal heart rate noted  Respiratory: Normal respiratory effort, no problems with respiration noted  Abdomen: Soft, gravid, appropriate for gestational age.  Pain/Pressure: Absent     Pelvic: Cervical exam deferred        Extremities: Normal range of motion.  Edema: None  Mental Status: Normal mood and affect. Normal behavior. Normal judgment and thought content.   Assessment and Plan:  Pregnancy: G2P1001 at [redacted]w[redacted]d 1. Supervision of other normal pregnancy, antepartum 28 wk labs today Offered and recommended flu and tdap - pt declines both Has one more growth - reviewed if normal, could then go to prn growth. Will see what results are.   Preterm labor symptoms and general obstetric precautions including but not limited to vaginal bleeding, contractions, leaking of fluid and fetal movement were reviewed in detail with the patient. Please refer to After Visit Summary for other counseling recommendations.   Return  in about 2 weeks (around 12/26/2021) for OB VISIT, MD or APP.  Future Appointments  Date Time Provider [place]  12/12/2021  9:10 AM Radene Gunning, MD [facility]  12/13/2021  1:30 PM [facility]  12/13/2021  1:45 PM [facility] US5 [facility]  12/26/2021  3:50 PM Radene Gunning, MD [facility]    Radene Gunning, MD

## 2021-12-09 NOTE — Telephone Encounter (Signed)
Left patient a message to call the office back. Please ask patient if 9:10 AM appointment is still available on 12/12/21 to move out of overbook at 8:10. Patient still can arrive at 8:00 AM for GTT.

## 2021-12-12 ENCOUNTER — Encounter: Payer: Self-pay | Admitting: Obstetrics and Gynecology

## 2021-12-12 ENCOUNTER — Telehealth: Payer: Self-pay | Admitting: *Deleted

## 2021-12-12 ENCOUNTER — Ambulatory Visit (INDEPENDENT_AMBULATORY_CARE_PROVIDER_SITE_OTHER): Payer: Medicaid Other | Admitting: Obstetrics and Gynecology

## 2021-12-12 ENCOUNTER — Encounter: Payer: Medicaid Other | Admitting: Obstetrics and Gynecology

## 2021-12-12 VITALS — BP 113/69 | HR 78 | Wt 201.0 lb

## 2021-12-12 DIAGNOSIS — Z3480 Encounter for supervision of other normal pregnancy, unspecified trimester: Secondary | ICD-10-CM

## 2021-12-12 DIAGNOSIS — Z3482 Encounter for supervision of other normal pregnancy, second trimester: Secondary | ICD-10-CM

## 2021-12-12 DIAGNOSIS — Z3A27 27 weeks gestation of pregnancy: Secondary | ICD-10-CM

## 2021-12-12 NOTE — Telephone Encounter (Signed)
Left patient a message that the office will call to schedule rest of OB appointments on 12/13/2021.

## 2021-12-12 NOTE — Progress Notes (Signed)
Pt declined flu and tdap today- might get at next appt

## 2021-12-13 ENCOUNTER — Ambulatory Visit: Payer: Medicaid Other | Admitting: *Deleted

## 2021-12-13 ENCOUNTER — Ambulatory Visit: Payer: Medicaid Other | Attending: Maternal & Fetal Medicine

## 2021-12-13 ENCOUNTER — Other Ambulatory Visit: Payer: Self-pay | Admitting: *Deleted

## 2021-12-13 VITALS — BP 109/63 | HR 72

## 2021-12-13 DIAGNOSIS — Z3A28 28 weeks gestation of pregnancy: Secondary | ICD-10-CM | POA: Diagnosis not present

## 2021-12-13 DIAGNOSIS — O99212 Obesity complicating pregnancy, second trimester: Secondary | ICD-10-CM | POA: Insufficient documentation

## 2021-12-13 DIAGNOSIS — O99213 Obesity complicating pregnancy, third trimester: Secondary | ICD-10-CM

## 2021-12-13 DIAGNOSIS — Z3480 Encounter for supervision of other normal pregnancy, unspecified trimester: Secondary | ICD-10-CM | POA: Insufficient documentation

## 2021-12-13 DIAGNOSIS — O321XX0 Maternal care for breech presentation, not applicable or unspecified: Secondary | ICD-10-CM | POA: Insufficient documentation

## 2021-12-13 DIAGNOSIS — E669 Obesity, unspecified: Secondary | ICD-10-CM

## 2021-12-13 LAB — CBC
HCT: 32.8 % — ABNORMAL LOW (ref 35.0–45.0)
Hemoglobin: 11.2 g/dL — ABNORMAL LOW (ref 11.7–15.5)
MCH: 29.4 pg (ref 27.0–33.0)
MCHC: 34.1 g/dL (ref 32.0–36.0)
MCV: 86.1 fL (ref 80.0–100.0)
MPV: 12 fL (ref 7.5–12.5)
Platelets: 223 10*3/uL (ref 140–400)
RBC: 3.81 10*6/uL (ref 3.80–5.10)
RDW: 13.1 % (ref 11.0–15.0)
WBC: 9.7 10*3/uL (ref 3.8–10.8)

## 2021-12-13 LAB — 2HR GTT W 1 HR, CARPENTER, 75 G
Glucose, 1 Hr, Gest: 113 mg/dL (ref 65–179)
Glucose, 2 Hr, Gest: 86 mg/dL (ref 65–152)
Glucose, Fasting, Gest: 74 mg/dL (ref 65–91)

## 2021-12-13 LAB — RPR: RPR Ser Ql: NONREACTIVE

## 2021-12-13 LAB — HIV ANTIBODY (ROUTINE TESTING W REFLEX): HIV 1&2 Ab, 4th Generation: NONREACTIVE

## 2021-12-23 NOTE — Progress Notes (Unsigned)
   PRENATAL VISIT NOTE  Subjective:  Lori Frederick is a 26 y.o. G2P1001 at 30w***d being seen today for ongoing prenatal care.  She is currently monitored for the following issues for this low-risk pregnancy and has Supervision of other normal pregnancy, antepartum and Migraine without aura on their problem list.  Patient reports {[id number]}.   .  .   . Denies leaking of fluid.   The following portions of the patient's history were reviewed and updated as appropriate: allergies, current medications, past family history, past medical history, past social history, past surgical history and problem list.   Objective:  There were no vitals filed for this visit.  Fetal Status:           General:  Alert, oriented and cooperative. Patient is in no acute distress.  Skin: Skin is warm and dry. No rash noted.   Cardiovascular: Normal heart rate noted  Respiratory: Normal respiratory effort, no problems with respiration noted  Abdomen: Soft, gravid, appropriate for gestational age.        Pelvic: Cervical exam deferred        Extremities: Normal range of motion.     Mental Status: Normal mood and affect. Normal behavior. Normal judgment and thought content.   Assessment and Plan:  Pregnancy: G2P1001 at 30w***d 1. Supervision of other normal pregnancy, antepartum Has growth scheduled for 11/3. Pt plans to go and then see if additional [place] is needed after that.  Declined tdap and flu shot.  28w labs wnl Rh pos  Preterm labor symptoms and general obstetric precautions including but not limited to vaginal bleeding, contractions, leaking of fluid and fetal movement were reviewed in detail with the patient. Please refer to After Visit Summary for other counseling recommendations.   No follow-ups on file.  Future Appointments  Date Time Provider [place]  12/26/2021  3:50 PM Radene Gunning, MD [facility]  01/24/2022  3:30 PM [facility]SE [facility]  01/24/2022  3:45 PM  [facility] US5 [facility]    Radene Gunning, MD

## 2021-12-26 ENCOUNTER — Ambulatory Visit (INDEPENDENT_AMBULATORY_CARE_PROVIDER_SITE_OTHER): Payer: Medicaid Other | Admitting: Obstetrics and Gynecology

## 2021-12-26 ENCOUNTER — Encounter: Payer: Self-pay | Admitting: Obstetrics and Gynecology

## 2021-12-26 VITALS — BP 114/64 | HR 86 | Wt 202.0 lb

## 2021-12-26 DIAGNOSIS — Z3480 Encounter for supervision of other normal pregnancy, unspecified trimester: Secondary | ICD-10-CM

## 2021-12-26 DIAGNOSIS — Z3A29 29 weeks gestation of pregnancy: Secondary | ICD-10-CM

## 2021-12-26 DIAGNOSIS — Z3483 Encounter for supervision of other normal pregnancy, third trimester: Secondary | ICD-10-CM

## 2022-01-16 ENCOUNTER — Encounter: Payer: Self-pay | Admitting: Obstetrics and Gynecology

## 2022-01-16 ENCOUNTER — Ambulatory Visit (INDEPENDENT_AMBULATORY_CARE_PROVIDER_SITE_OTHER): Payer: Medicaid Other | Admitting: Obstetrics and Gynecology

## 2022-01-16 VITALS — BP 114/73 | HR 97 | Wt 206.0 lb

## 2022-01-16 DIAGNOSIS — Z3A32 32 weeks gestation of pregnancy: Secondary | ICD-10-CM

## 2022-01-16 DIAGNOSIS — G43009 Migraine without aura, not intractable, without status migrainosus: Secondary | ICD-10-CM

## 2022-01-16 DIAGNOSIS — Z3480 Encounter for supervision of other normal pregnancy, unspecified trimester: Secondary | ICD-10-CM

## 2022-01-16 DIAGNOSIS — Z3483 Encounter for supervision of other normal pregnancy, third trimester: Secondary | ICD-10-CM

## 2022-01-16 NOTE — Progress Notes (Signed)
   PRENATAL VISIT NOTE  Subjective:  Lori Frederick is a 26 y.o. G2P1001 at [redacted]w[redacted]d being seen today for ongoing prenatal care.  She is currently monitored for the following issues for this low-risk pregnancy and has Maternal obesity affecting pregnancy, antepartum; Supervision of other normal pregnancy, antepartum; and Migraine without aura on their problem list.  Patient reports no complaints.  Contractions: Not present. Vag. Bleeding: None.  Movement: Present. Denies leaking of fluid.   The following portions of the patient's history were reviewed and updated as appropriate: allergies, current medications, past family history, past medical history, past social history, past surgical history and problem list.   Objective:   Vitals:   01/16/22 1533  BP: 114/73  Pulse: 97  Weight: 206 lb (93.4 kg)    Fetal Status: Fetal Heart Rate (bpm): 139 Fundal Height: 32 cm Movement: Present     General:  Alert, oriented and cooperative. Patient is in no acute distress.  Skin: Skin is warm and dry. No rash noted.   Cardiovascular: Normal heart rate noted  Respiratory: Normal respiratory effort, no problems with respiration noted  Abdomen: Soft, gravid, appropriate for gestational age.  Pain/Pressure: Present     Pelvic: Cervical exam deferred        Extremities: Normal range of motion.  Edema: Trace  Mental Status: Normal mood and affect. Normal behavior. Normal judgment and thought content.   Assessment and Plan:  Pregnancy: G2P1001 at [redacted]w[redacted]d 1. Supervision of other normal pregnancy, antepartum Patient is doing well without complaints Continue ASA Follow up growth ultrasound  2. Migraine without aura and without status migrainosus, not intractable Stable  Preterm labor symptoms and general obstetric precautions including but not limited to vaginal bleeding, contractions, leaking of fluid and fetal movement were reviewed in detail with the patient. Please refer to After Visit Summary for  other counseling recommendations.   No follow-ups on file.  Future Appointments  Date Time Provider [place]  01/24/2022  3:30 PM [facility] NURSE [facility]  01/24/2022  3:45 PM [facility] US5 [facility]  01/30/2022  3:30 PM Anyanwu, Sallyanne Havers, MD [facility]  02/10/2022  3:10 PM Radene Gunning, MD [facility]  02/18/2022  3:10 PM Rasch, Artist Pais, NP [facility]  02/25/2022  3:10 PM Julianne Handler, CNM [facility]    Mora Bellman, MD

## 2022-01-24 ENCOUNTER — Ambulatory Visit: Payer: Medicaid Other | Admitting: *Deleted

## 2022-01-24 ENCOUNTER — Ambulatory Visit: Payer: Medicaid Other | Attending: Obstetrics and Gynecology

## 2022-01-24 VITALS — BP 114/65 | HR 74

## 2022-01-24 DIAGNOSIS — O99213 Obesity complicating pregnancy, third trimester: Secondary | ICD-10-CM | POA: Diagnosis not present

## 2022-01-24 DIAGNOSIS — Z3A34 34 weeks gestation of pregnancy: Secondary | ICD-10-CM | POA: Insufficient documentation

## 2022-01-24 DIAGNOSIS — Z362 Encounter for other antenatal screening follow-up: Secondary | ICD-10-CM | POA: Insufficient documentation

## 2022-01-24 DIAGNOSIS — E669 Obesity, unspecified: Secondary | ICD-10-CM | POA: Diagnosis not present

## 2022-01-24 DIAGNOSIS — Z3480 Encounter for supervision of other normal pregnancy, unspecified trimester: Secondary | ICD-10-CM

## 2022-01-30 ENCOUNTER — Encounter: Payer: Self-pay | Admitting: Obstetrics & Gynecology

## 2022-01-30 ENCOUNTER — Ambulatory Visit (INDEPENDENT_AMBULATORY_CARE_PROVIDER_SITE_OTHER): Payer: Medicaid Other | Admitting: Obstetrics & Gynecology

## 2022-01-30 VITALS — BP 118/73 | HR 93 | Wt 206.0 lb

## 2022-01-30 DIAGNOSIS — Z3483 Encounter for supervision of other normal pregnancy, third trimester: Secondary | ICD-10-CM

## 2022-01-30 DIAGNOSIS — Z3A34 34 weeks gestation of pregnancy: Secondary | ICD-10-CM

## 2022-01-30 DIAGNOSIS — Z3480 Encounter for supervision of other normal pregnancy, unspecified trimester: Secondary | ICD-10-CM

## 2022-01-30 NOTE — Progress Notes (Signed)
PRENATAL VISIT NOTE  Subjective:  Lori Frederick is a 26 y.o. G2P1001 at [redacted]w[redacted]d being seen today for ongoing prenatal care.  She is currently monitored for the following issues for this low-risk pregnancy and has Maternal obesity affecting pregnancy, antepartum; Supervision of other normal pregnancy, antepartum; and Migraine without aura on their problem list.  Patient reports no complaints.  Contractions: Not present. Vag. Bleeding: None.  Movement: Present. Denies leaking of fluid.   The following portions of the patient's history were reviewed and updated as appropriate: allergies, current medications, past family history, past medical history, past social history, past surgical history and problem list.   Objective:   Vitals:   01/30/22 1534  BP: 118/73  Pulse: 93  Weight: 206 lb (93.4 kg)    Fetal Status: Fetal Heart Rate (bpm): 132   Movement: Present     General:  Alert, oriented and cooperative. Patient is in no acute distress.  Skin: Skin is warm and dry. No rash noted.   Cardiovascular: Normal heart rate noted  Respiratory: Normal respiratory effort, no problems with respiration noted  Abdomen: Soft, gravid, appropriate for gestational age.  Pain/Pressure: Absent     Pelvic: Cervical exam deferred        Extremities: Normal range of motion.  Edema: None  Mental Status: Normal mood and affect. Normal behavior. Normal judgment and thought content.   Imaging: [place] MFM OB FOLLOW UP  Result Date: 01/24/2022 ----------------------------------------------------------------------  OBSTETRICS REPORT                       (Signed Final 01/24/2022 04:23 pm) ---------------------------------------------------------------------- Patient Info  ID #:       [id number]                          D.O.B.:  December 20, 1995 (26 yrs)  Name:       Lori Frederick               Visit Date: 01/24/2022 11:57 am ---------------------------------------------------------------------- Performed By  Attending:         Ma Rings MD         Ref. Address:     [address]                                                             [place], [place]                                                             [address]  Performed By:     Isac Sarna        [facility]  Referred By:      Duane Lope NP ---------------------------------------------------------------------- Orders  #  Description                           Code        Ordered By  1  [place] MFM OB FOLLOW UP                   [phone]    YU FANG ----------------------------------------------------------------------  #  Order #                     Accession #                Episode #  1  [id number]                   [id number]                 [id number] ---------------------------------------------------------------------- Indications  Obesity complicating pregnancy, third          O99.213  trimester (BMI 30)  LR-NIPS/Horizon-Neg/AFP-Neg  [redacted] weeks gestation of pregnancy                Z3A.34  Encounter for other antenatal screening        Z36.2  follow-up ---------------------------------------------------------------------- Fetal Evaluation  Num Of Fetuses:         1  Fetal Heart Rate(bpm):  128  Cardiac Activity:       Observed  Presentation:           Cephalic  Placenta:               Posterior  P. Cord Insertion:      Previously Visualized  Amniotic Fluid  AFI FV:      Within normal limits  AFI Sum(cm)     %Tile       Largest Pocket(cm)  19.5            73          6.7  RUQ(cm)       RLQ(cm)       LUQ(cm)        LLQ(cm)  6.7           4.4           4.2            4.2 ---------------------------------------------------------------------- Biometry  BPD:      85.9  mm     G. Age:  34w 4d         66  %    CI:         82.47   %    70 - 86  FL/HC:      21.2   %    19.4 - 21.8  HC:      298.4  mm     G. Age:  33w 0d        4.4  %    HC/AC:      1.05        0.96 - 1.11  AC:      285.4  mm     G. Age:  32w 4d         15  %    FL/BPD:     73.6   %    71 - 87  FL:       63.2  mm     G. Age:  32w 5d         12  %    FL/AC:      22.1   %    20 - 24  LV:        7.1  mm  Est. FW:    2059  gm      4 lb 9 oz     15  % ---------------------------------------------------------------------- OB History  Gravidity:    2         Term:   1        Prem:   0        SAB:   0  TOP:          0       Ectopic:  0        Living: 1 ---------------------------------------------------------------------- Gestational Age  LMP:           34w 0d        Date:  05/31/21                  EDD:   03/07/22  U/S Today:     33w 2d                                        EDD:   03/12/22  Best:          34w 0d     Det. By:  LMP  (05/31/21)          EDD:   03/07/22 ---------------------------------------------------------------------- Anatomy  Cranium:               Appears normal         LVOT:                   Previously seen  Cavum:                 Appears normal         Aortic Arch:            Previously seen  Ventricles:            Previously seen        Ductal Arch:            Previously seen  Choroid Plexus:        Previously seen        Diaphragm:              Appears normal  Cerebellum:            Previously seen        Stomach:  Appears normal, left                                                                        sided  Posterior Fossa:       Previously seen        Abdomen:                Appears normal  Nuchal Fold:           Previously seen        Abdominal Wall:         Previously seen  Face:                  Orbits and profile     Cord Vessels:           Previously seen                         previously seen  Lips:                  Previously seen        Kidneys:                Appear  normal  Palate:                Not well visualized    Bladder:                Appears normal  Thoracic:              Appears normal         Spine:                  Previously seen  Heart:                 Previously seen        Upper Extremities:      Previously seen  RVOT:                  Previously seen        Lower Extremities:      Previously seen  Other:  VC, 3VV and 3VTV prev. visualized. Nasal bone, lenses, maxilla,          mandible and falx prev.visualized. Heels/feet and open hands/5th          digits prev. visualized. Fetus appears to be a female. ---------------------------------------------------------------------- Comments  This patient was seen for a follow up growth scan due to  maternal obesity.  She denies any problems since her last  exam.  She was informed that the fetal growth and amniotic fluid  level appears appropriate for her gestational age.  As the fetal growth is within normal limits, no further exams  were scheduled in our office. ----------------------------------------------------------------------                   Ma Rings, MD Electronically Signed Final Report   01/24/2022 04:23 pm ----------------------------------------------------------------------   Assessment and Plan:  Pregnancy: G2P1001 at [redacted]w[redacted]d 1. [redacted] weeks gestation of pregnancy 2. Supervision of other normal pregnancy, antepartum EFW 15%, ultrasound reassuring.  No other concerns. Pelvic cultures next visit.  Preterm labor  symptoms and general obstetric precautions including but not limited to vaginal bleeding, contractions, leaking of fluid and fetal movement were reviewed in detail with the patient. Please refer to After Visit Summary for other counseling recommendations.   Return in about 2 weeks (around 02/13/2022) for Pelvic cultures, OFFICE OB VISIT (MD or APP).  Future Appointments  Date Time Provider Department Center  02/10/2022  3:10 PM Milas Hockuncan, Paula, MD [facility]  02/18/2022  3:10 PM  Rasch, Harolyn RutherfordJennifer I, NP [facility]  02/25/2022  3:10 PM Donette LarryBhambri, Melanie, CNM [facility]    Jaynie CollinsUgonna Tayte Mcwherter, MD

## 2022-01-30 NOTE — Patient Instructions (Signed)
Return to office for any scheduled appointments. Call the office or go to the MAU at [facility] if: You begin to have strong, frequent contractions Your water breaks.  Sometimes it is a big gush of fluid, sometimes it is just a trickle that keeps getting your underwear wet or running down your legs You have vaginal bleeding.  It is normal to have a small amount of spotting if your cervix was checked.  You do not feel your baby moving like normal.  If you do not, get something to eat and drink and lay down and focus on feeling your baby move.   If your baby is still not moving like normal, you should call the office or go to MAU. Any other obstetric concerns.  

## 2022-02-07 NOTE — Progress Notes (Unsigned)
   PRENATAL VISIT NOTE  Subjective:  Lori Frederick is a 26 y.o. G2P1001 at [redacted]w[redacted]d being seen today for ongoing prenatal care.  She is currently monitored for the following issues for this low-risk pregnancy and has Maternal obesity affecting pregnancy, antepartum; Supervision of other normal pregnancy, antepartum; and Migraine without aura on their problem list.  Patient reports no complaints- was seen in a few days ago for ctxns but better now. Was 2 cm.  Contractions: Not present. Vag. Bleeding: None.  Movement: Present. Denies leaking of fluid.   The following portions of the patient's history were reviewed and updated as appropriate: allergies, current medications, past family history, past medical history, past social history, past surgical history and problem list.   Objective:   Vitals:   02/10/22 1459  BP: 107/70  Pulse: 91  Weight: 208 lb (94.3 kg)    Fetal Status: Fetal Heart Rate (bpm): 146 Fundal Height: 36 cm Movement: Present  Presentation: Vertex  General:  Alert, oriented and cooperative. Patient is in no acute distress.  Skin: Skin is warm and dry. No rash noted.   Cardiovascular: Normal heart rate noted  Respiratory: Normal respiratory effort, no problems with respiration noted  Abdomen: Soft, gravid, appropriate for gestational age.  Pain/Pressure: Present     Pelvic: Cervical exam performed in the presence of a chaperone Dilation: 2 Effacement (%): 60 Station: -2  Extremities: Normal range of motion.  Edema: None  Mental Status: Normal mood and affect. Normal behavior. Normal judgment and thought content.   Assessment and Plan:  Pregnancy: G2P1001 at [redacted]w[redacted]d 1. Supervision of other normal pregnancy, antepartum GBS and cx done today 11/3 growth was wnl    Preterm labor symptoms and general obstetric precautions including but not limited to vaginal bleeding, contractions, leaking of fluid and fetal movement were reviewed in detail with the patient. Please  refer to After Visit Summary for other counseling recommendations.   Return in about 1 week (around 02/17/2022) for OB VISIT, MD or APP.  Future Appointments  Date Time Provider Department Center  02/18/2022  3:10 PM Rasch, Harolyn Rutherford, NP [facility]  02/25/2022  3:10 PM Donette Larry, CNM [facility]    Milas Hock, MD

## 2022-02-10 ENCOUNTER — Other Ambulatory Visit (HOSPITAL_COMMUNITY)
Admission: RE | Admit: 2022-02-10 | Discharge: 2022-02-10 | Disposition: A | Discharge status: Home / Self Care | Payer: Medicaid Other | Source: Ambulatory Visit | Attending: Obstetrics and Gynecology | Admitting: Obstetrics and Gynecology

## 2022-02-10 ENCOUNTER — Encounter: Payer: Self-pay | Admitting: Obstetrics and Gynecology

## 2022-02-10 ENCOUNTER — Ambulatory Visit (INDEPENDENT_AMBULATORY_CARE_PROVIDER_SITE_OTHER): Payer: Medicaid Other | Admitting: Obstetrics and Gynecology

## 2022-02-10 VITALS — BP 107/70 | HR 91 | Wt 208.0 lb

## 2022-02-10 DIAGNOSIS — Z3480 Encounter for supervision of other normal pregnancy, unspecified trimester: Secondary | ICD-10-CM | POA: Diagnosis present

## 2022-02-10 DIAGNOSIS — Z3A36 36 weeks gestation of pregnancy: Secondary | ICD-10-CM

## 2022-02-10 DIAGNOSIS — Z3483 Encounter for supervision of other normal pregnancy, third trimester: Secondary | ICD-10-CM

## 2022-02-10 LAB — OB RESULTS CONSOLE GBS: GBS: NEGATIVE

## 2022-02-11 LAB — CERVICOVAGINAL ANCILLARY ONLY
Chlamydia: NEGATIVE
Comment: NEGATIVE
Comment: NORMAL
Neisseria Gonorrhea: NEGATIVE

## 2022-02-14 LAB — CULTURE, BETA STREP (GROUP B ONLY)
MICRO NUMBER:: 14218386
SPECIMEN QUALITY:: ADEQUATE

## 2022-02-18 ENCOUNTER — Ambulatory Visit (INDEPENDENT_AMBULATORY_CARE_PROVIDER_SITE_OTHER): Payer: Medicaid Other | Admitting: Obstetrics and Gynecology

## 2022-02-18 VITALS — BP 107/67 | HR 80 | Wt 210.0 lb

## 2022-02-18 DIAGNOSIS — Z3480 Encounter for supervision of other normal pregnancy, unspecified trimester: Secondary | ICD-10-CM

## 2022-02-18 DIAGNOSIS — Z3A37 37 weeks gestation of pregnancy: Secondary | ICD-10-CM

## 2022-02-18 DIAGNOSIS — Z3483 Encounter for supervision of other normal pregnancy, third trimester: Secondary | ICD-10-CM

## 2022-02-18 NOTE — Progress Notes (Signed)
   PRENATAL VISIT NOTE  Subjective:  Lori Frederick is a 26 y.o. G2P1001 at [redacted]w[redacted]d being seen today for ongoing prenatal care.  She is currently monitored for the following issues for this low-risk pregnancy and has Maternal obesity affecting pregnancy, antepartum; Supervision of other normal pregnancy, antepartum; and Migraine without aura on their problem list.  Patient reports no complaints.  Contractions: Not present. Vag. Bleeding: None.  Movement: Present. Denies leaking of fluid.   The following portions of the patient's history were reviewed and updated as appropriate: allergies, current medications, past family history, past medical history, past social history, past surgical history and problem list.   Objective:   Vitals:   02/18/22 1510  BP: 107/67  Pulse: 80  Weight: 210 lb (95.3 kg)    Fetal Status: Fetal Heart Rate (bpm): 132 Fundal Height: 38 cm Movement: Present     General:  Alert, oriented and cooperative. Patient is in no acute distress.  Skin: Skin is warm and dry. No rash noted.   Cardiovascular: Normal heart rate noted  Respiratory: Normal respiratory effort, no problems with respiration noted  Abdomen: Soft, gravid, appropriate for gestational age.  Pain/Pressure: Present     Pelvic: Cervical exam deferred        Extremities: Normal range of motion.  Edema: None  Mental Status: Normal mood and affect. Normal behavior. Normal judgment and thought content.   Assessment and Plan:  Pregnancy: G2P1001 at [redacted]w[redacted]d  1. Supervision of other normal pregnancy, antepartum  Doing well Baby is moving well Some occasional cramping/ braxton hicks.  BP good.  Declined cervical exam.   Preterm labor symptoms and general obstetric precautions including but not limited to vaginal bleeding, contractions, leaking of fluid and fetal movement were reviewed in detail with the patient. Please refer to After Visit Summary for other counseling recommendations.   No follow-ups  on file.  Future Appointments  Date Time Provider Department Center  02/25/2022  3:10 PM Donette Larry, CNM [facility]    Venia Carbon, NP

## 2022-02-25 ENCOUNTER — Ambulatory Visit (INDEPENDENT_AMBULATORY_CARE_PROVIDER_SITE_OTHER): Payer: Medicaid Other | Admitting: Certified Nurse Midwife

## 2022-02-25 VITALS — BP 105/67 | HR 83 | Wt 210.0 lb

## 2022-02-25 DIAGNOSIS — Z3483 Encounter for supervision of other normal pregnancy, third trimester: Secondary | ICD-10-CM

## 2022-02-25 DIAGNOSIS — Z3A38 38 weeks gestation of pregnancy: Secondary | ICD-10-CM

## 2022-02-25 DIAGNOSIS — Z3480 Encounter for supervision of other normal pregnancy, unspecified trimester: Secondary | ICD-10-CM

## 2022-02-25 NOTE — Progress Notes (Signed)
Subjective:  Lori Frederick is a 26 y.o. G2P1001 at [redacted]w[redacted]d being seen today for ongoing prenatal care.  She is currently monitored for the following issues for this low-risk pregnancy and has Maternal obesity affecting pregnancy, antepartum; Supervision of other normal pregnancy, antepartum; and Migraine without aura on their problem list.  Patient reports no complaints.  Contractions: Not present. Vag. Bleeding: None.  Movement: Present. Denies leaking of fluid.   The following portions of the patient's history were reviewed and updated as appropriate: allergies, current medications, past family history, past medical history, past social history, past surgical history and problem list. Problem list updated.  Objective:   Vitals:   02/25/22 1517  BP: 105/67  Pulse: 83  Weight: 210 lb (95.3 kg)    Fetal Status: Fetal Heart Rate (bpm): 128 Fundal Height: 38 cm Movement: Present  Presentation: Vertex  General:  Alert, oriented and cooperative. Patient is in no acute distress.  Skin: Skin is warm and dry. No rash noted.   Cardiovascular: Normal heart rate noted  Respiratory: Normal respiratory effort, no problems with respiration noted  Abdomen: Soft, gravid, appropriate for gestational age. Pain/Pressure: Present     Pelvic: Vag. Bleeding: None Vag D/C Character: Thin   Cervical exam performed Dilation: 3 Effacement (%): 70 Station: -2  Extremities: Normal range of motion.  Edema: None  Mental Status: Normal mood and affect. Normal behavior. Normal judgment and thought content.   Urinalysis:      Assessment and Plan:  Pregnancy: G2P1001 at [redacted]w[redacted]d  1. Supervision of other normal pregnancy, antepartum  2. [redacted] weeks gestation of pregnancy  Term labor symptoms and general obstetric precautions including but not limited to vaginal bleeding, contractions, leaking of fluid and fetal movement were reviewed in detail with the patient. Please refer to After Visit Summary for other counseling  recommendations.  Return in about 1 week (around 03/04/2022).   Donette Larry, CNM

## 2022-02-28 ENCOUNTER — Telehealth: Payer: Self-pay | Admitting: *Deleted

## 2022-02-28 NOTE — Telephone Encounter (Signed)
Left patient a message if she wanted to take an earlier appointment on 03/04/22 at 8:50 AM.

## 2022-03-04 ENCOUNTER — Ambulatory Visit (INDEPENDENT_AMBULATORY_CARE_PROVIDER_SITE_OTHER): Payer: Medicaid Other | Admitting: Obstetrics and Gynecology

## 2022-03-04 VITALS — BP 106/69 | HR 83 | Wt 209.0 lb

## 2022-03-04 DIAGNOSIS — Z3480 Encounter for supervision of other normal pregnancy, unspecified trimester: Secondary | ICD-10-CM

## 2022-03-04 NOTE — Progress Notes (Signed)
   PRENATAL VISIT NOTE  Subjective:  Lori Frederick is a 26 y.o. G2P1001 at [redacted]w[redacted]d being seen today for ongoing prenatal care.  She is currently monitored for the following issues for this low-risk pregnancy and has Maternal obesity affecting pregnancy, antepartum; Supervision of other normal pregnancy, antepartum; and Migraine without aura on their problem list.  Patient reports no complaints.  Contractions: Not present. Vag. Bleeding: None.  Movement: Present. Denies leaking of fluid.   The following portions of the patient's history were reviewed and updated as appropriate: allergies, current medications, past family history, past medical history, past social history, past surgical history and problem list.   Objective:   Vitals:   03/04/22 1434  BP: 106/69  Pulse: 83  Weight: 209 lb (94.8 kg)    Fetal Status: Fetal Heart Rate (bpm): 145   Movement: Present  Presentation: Vertex  General:  Alert, oriented and cooperative. Patient is in no acute distress.  Skin: Skin is warm and dry. No rash noted.   Cardiovascular: Normal heart rate noted  Respiratory: Normal respiratory effort, no problems with respiration noted  Abdomen: Soft, gravid, appropriate for gestational age.  Pain/Pressure: Present     Pelvic: Cervical exam performed in the presence of a chaperone Dilation: 3 Effacement (%): 50 Station: -3  Extremities: Normal range of motion.  Edema: None  Mental Status: Normal mood and affect. Normal behavior. Normal judgment and thought content.   Assessment and Plan:  Pregnancy: G2P1001 at [redacted]w[redacted]d 1. Supervision of other normal pregnancy, antepartum  Induction scheduled for next week. She is already 3 cm.   NST next week. Labor precautions.    Preterm labor symptoms and general obstetric precautions including but not limited to vaginal bleeding, contractions, leaking of fluid and fetal movement were reviewed in detail with the patient. Please refer to After Visit Summary for  other counseling recommendations.   No follow-ups on file.  Future Appointments  Date Time Provider Department Center  03/11/2022  2:50 PM Brand Males, CNM [facility]  03/14/2022  7:00 AM [facility] SCHED ROOM [facility] None    Venia Carbon, NP

## 2022-03-06 ENCOUNTER — Encounter (HOSPITAL_COMMUNITY): Payer: Self-pay | Admitting: *Deleted

## 2022-03-06 ENCOUNTER — Telehealth (HOSPITAL_COMMUNITY): Payer: Self-pay | Admitting: *Deleted

## 2022-03-06 NOTE — Telephone Encounter (Signed)
Preadmission screen  

## 2022-03-11 ENCOUNTER — Ambulatory Visit (INDEPENDENT_AMBULATORY_CARE_PROVIDER_SITE_OTHER): Payer: Medicaid Other

## 2022-03-11 ENCOUNTER — Ambulatory Visit (INDEPENDENT_AMBULATORY_CARE_PROVIDER_SITE_OTHER): Payer: Medicaid Other | Admitting: *Deleted

## 2022-03-11 VITALS — BP 109/72 | HR 75 | Wt 209.0 lb

## 2022-03-11 DIAGNOSIS — Z3A40 40 weeks gestation of pregnancy: Secondary | ICD-10-CM | POA: Diagnosis not present

## 2022-03-11 DIAGNOSIS — Z3480 Encounter for supervision of other normal pregnancy, unspecified trimester: Secondary | ICD-10-CM

## 2022-03-11 DIAGNOSIS — Z3493 Encounter for supervision of normal pregnancy, unspecified, third trimester: Secondary | ICD-10-CM

## 2022-03-11 DIAGNOSIS — Z3490 Encounter for supervision of normal pregnancy, unspecified, unspecified trimester: Secondary | ICD-10-CM

## 2022-03-11 DIAGNOSIS — O480 Post-term pregnancy: Secondary | ICD-10-CM | POA: Diagnosis not present

## 2022-03-11 NOTE — Progress Notes (Signed)
Pt here for NST due to post dates.  NST is reactive and reviewed by D Simpson,CNM @ bedside.  Pt is scheduled for IOL on Friday 03/14/22

## 2022-03-11 NOTE — Progress Notes (Signed)
   PRENATAL VISIT NOTE  Subjective:  Lori Frederick is a 26 y.o. G2P1001 at [redacted]w[redacted]d being seen today for ongoing prenatal care.  She is currently monitored for the following issues for this low-risk pregnancy and has Maternal obesity affecting pregnancy, antepartum; Supervision of other normal pregnancy, antepartum; and Migraine without aura on their problem list.  Patient reports no complaints.  Contractions: Irritability. Vag. Bleeding: None.  Movement: Present. Denies leaking of fluid.   The following portions of the patient's history were reviewed and updated as appropriate: allergies, current medications, past family history, past medical history, past social history, past surgical history and problem list.   Objective:   Vitals:   03/11/22 1420  BP: 109/72  Pulse: 75  Weight: 209 lb (94.8 kg)    Fetal Status:     Movement: Present     General:  Alert, oriented and cooperative. Patient is in no acute distress.  Skin: Skin is warm and dry. No rash noted.   Cardiovascular: Normal heart rate noted  Respiratory: Normal respiratory effort, no problems with respiration noted  Abdomen: Soft, gravid, appropriate for gestational age.  Pain/Pressure: Absent     Pelvic: Cervical exam deferred        Extremities: Normal range of motion.  Edema: Trace  Mental Status: Normal mood and affect. Normal behavior. Normal judgment and thought content.   Assessment and Plan:  Pregnancy: G2P1001 at [redacted]w[redacted]d 1. Encounter for supervision of normal pregnancy, antepartum, unspecified gravidity - Routine OB. Doing well, no concerns - Reviewed IOL- scheduled on 12/22 - NST reactive and reassuring  2. [redacted] weeks gestation of pregnancy - Endorses active fetal movement  Term labor symptoms and general obstetric precautions including but not limited to vaginal bleeding, contractions, leaking of fluid and fetal movement were reviewed in detail with the patient. Please refer to After Visit Summary for other  counseling recommendations.   No follow-ups on file.  Future Appointments  Date Time Provider Department Center  03/14/2022  7:00 AM [facility] SCHED ROOM [facility] None  04/07/2022  1:10 PM Myna Hidalgo, DO [facility]    Brand Males, CNM

## 2022-03-11 NOTE — Progress Notes (Signed)
Pt here for NST due to post dates.

## 2022-03-14 ENCOUNTER — Inpatient Hospital Stay (HOSPITAL_COMMUNITY): Payer: Medicaid Other | Admitting: Anesthesiology

## 2022-03-14 ENCOUNTER — Inpatient Hospital Stay (HOSPITAL_COMMUNITY): Payer: Medicaid Other

## 2022-03-14 ENCOUNTER — Encounter (HOSPITAL_COMMUNITY): Payer: Self-pay | Admitting: Family Medicine

## 2022-03-14 ENCOUNTER — Other Ambulatory Visit: Payer: Self-pay

## 2022-03-14 ENCOUNTER — Inpatient Hospital Stay (HOSPITAL_COMMUNITY)
Admission: AD | Admit: 2022-03-14 | Discharge: 2022-03-16 | DRG: 807 | Disposition: A | Discharge status: Home / Self Care | Payer: Medicaid Other | Attending: Obstetrics and Gynecology | Admitting: Obstetrics and Gynecology

## 2022-03-14 DIAGNOSIS — O99214 Obesity complicating childbirth: Secondary | ICD-10-CM | POA: Diagnosis present

## 2022-03-14 DIAGNOSIS — Z30430 Encounter for insertion of intrauterine contraceptive device: Secondary | ICD-10-CM

## 2022-03-14 DIAGNOSIS — Z975 Presence of (intrauterine) contraceptive device: Secondary | ICD-10-CM

## 2022-03-14 DIAGNOSIS — O480 Post-term pregnancy: Secondary | ICD-10-CM | POA: Diagnosis present

## 2022-03-14 DIAGNOSIS — Z3A41 41 weeks gestation of pregnancy: Secondary | ICD-10-CM

## 2022-03-14 DIAGNOSIS — Z7982 Long term (current) use of aspirin: Secondary | ICD-10-CM | POA: Diagnosis not present

## 2022-03-14 DIAGNOSIS — Z87891 Personal history of nicotine dependence: Secondary | ICD-10-CM

## 2022-03-14 DIAGNOSIS — Z3480 Encounter for supervision of other normal pregnancy, unspecified trimester: Secondary | ICD-10-CM

## 2022-03-14 LAB — CBC
HCT: 33.4 % — ABNORMAL LOW (ref 36.0–46.0)
Hemoglobin: 11.6 g/dL — ABNORMAL LOW (ref 12.0–15.0)
MCH: 29.4 pg (ref 26.0–34.0)
MCHC: 34.7 g/dL (ref 30.0–36.0)
MCV: 84.8 fL (ref 80.0–100.0)
Platelets: 172 10*3/uL (ref 150–400)
RBC: 3.94 MIL/uL (ref 3.87–5.11)
RDW: 12.9 % (ref 11.5–15.5)
WBC: 8.9 10*3/uL (ref 4.0–10.5)
nRBC: 0 % (ref 0.0–0.2)

## 2022-03-14 LAB — TYPE AND SCREEN
ABO/RH(D): A POS
Antibody Screen: NEGATIVE

## 2022-03-14 MED ORDER — ONDANSETRON HCL 4 MG/2ML IJ SOLN: 4.0000 mg | Freq: Four times a day (QID) | INTRAMUSCULAR | Status: DC | PRN

## 2022-03-14 MED ORDER — EPHEDRINE 5 MG/ML INJ: 10.0000 mg | INTRAVENOUS | Status: DC | PRN

## 2022-03-14 MED ORDER — OXYTOCIN-SODIUM CHLORIDE 30-0.9 UT/500ML-% IV SOLN
2.5000 [IU]/h | INTRAVENOUS | Status: DC
  Filled 2022-03-14: qty 500

## 2022-03-14 MED ORDER — FENTANYL-BUPIVACAINE-NACL 0.5-0.125-0.9 MG/250ML-% EP SOLN
EPIDURAL | Status: AC
  Filled 2022-03-14: qty 250

## 2022-03-14 MED ORDER — MISOPROSTOL 50MCG HALF TABLET
50.0000 ug | ORAL_TABLET | Freq: Once | ORAL | Status: AC
  Administered 2022-03-14: 50 ug via ORAL
  Filled 2022-03-14: qty 1

## 2022-03-14 MED ORDER — LACTATED RINGERS IV SOLN
500.0000 mL | Freq: Once | INTRAVENOUS | Status: AC
  Administered 2022-03-14: 500 mL via INTRAVENOUS

## 2022-03-14 MED ORDER — ACETAMINOPHEN 325 MG PO TABS: 650.0000 mg | ORAL_TABLET | ORAL | Status: DC | PRN

## 2022-03-14 MED ORDER — MISOPROSTOL 25 MCG QUARTER TABLET
25.0000 ug | ORAL_TABLET | Freq: Once | ORAL | Status: AC
  Administered 2022-03-14: 25 ug via VAGINAL
  Filled 2022-03-14: qty 1

## 2022-03-14 MED ORDER — LACTATED RINGERS IV SOLN: INTRAVENOUS | Status: DC

## 2022-03-14 MED ORDER — LIDOCAINE HCL (PF) 1 % IJ SOLN
INTRAMUSCULAR | Status: DC | PRN
  Administered 2022-03-14: 11 mL via EPIDURAL

## 2022-03-14 MED ORDER — OXYCODONE-ACETAMINOPHEN 5-325 MG PO TABS: 1.0000 | ORAL_TABLET | ORAL | Status: DC | PRN

## 2022-03-14 MED ORDER — FENTANYL-BUPIVACAINE-NACL 0.5-0.125-0.9 MG/250ML-% EP SOLN
12.0000 mL/h | EPIDURAL | Status: DC | PRN
  Administered 2022-03-14: 12 mL/h via EPIDURAL

## 2022-03-14 MED ORDER — LIDOCAINE HCL (PF) 1 % IJ SOLN: 30.0000 mL | INTRAMUSCULAR | Status: DC | PRN

## 2022-03-14 MED ORDER — LEVONORGESTREL 20 MCG/DAY IU IUD
1.0000 | INTRAUTERINE_SYSTEM | Freq: Once | INTRAUTERINE | Status: AC
  Administered 2022-03-14: 1 via INTRAUTERINE
  Filled 2022-03-14: qty 1

## 2022-03-14 MED ORDER — SOD CITRATE-CITRIC ACID 500-334 MG/5ML PO SOLN: 30.0000 mL | ORAL | Status: DC | PRN

## 2022-03-14 MED ORDER — TERBUTALINE SULFATE 1 MG/ML IJ SOLN
0.2500 mg | Freq: Once | INTRAMUSCULAR | Status: AC
  Administered 2022-03-14: 0.25 mg via SUBCUTANEOUS

## 2022-03-14 MED ORDER — TERBUTALINE SULFATE 1 MG/ML IJ SOLN
INTRAMUSCULAR | Status: AC
  Filled 2022-03-14: qty 1

## 2022-03-14 MED ORDER — DIPHENHYDRAMINE HCL 50 MG/ML IJ SOLN: 12.5000 mg | INTRAMUSCULAR | Status: DC | PRN

## 2022-03-14 MED ORDER — LACTATED RINGERS IV SOLN: 500.0000 mL | INTRAVENOUS | Status: DC | PRN

## 2022-03-14 MED ORDER — PHENYLEPHRINE 80 MCG/ML (10ML) SYRINGE FOR IV PUSH (FOR BLOOD PRESSURE SUPPORT): 80.0000 ug | PREFILLED_SYRINGE | INTRAVENOUS | Status: DC | PRN

## 2022-03-14 MED ORDER — OXYTOCIN BOLUS FROM INFUSION
333.0000 mL | Freq: Once | INTRAVENOUS | Status: AC
  Administered 2022-03-14: 333 mL via INTRAVENOUS

## 2022-03-14 MED ORDER — OXYCODONE-ACETAMINOPHEN 5-325 MG PO TABS: 2.0000 | ORAL_TABLET | ORAL | Status: DC | PRN

## 2022-03-14 NOTE — Anesthesia Preprocedure Evaluation (Signed)
Anesthesia Evaluation  Patient identified by MRN, date of birth, ID band Patient awake    Reviewed: Allergy & Precautions, H&P , NPO status , Patient's Chart, lab work & pertinent test results  Airway Mallampati: II  TM Distance: >3 FB Neck ROM: Full    Dental no notable dental hx.    Pulmonary neg pulmonary ROS, former smoker   Pulmonary exam normal breath sounds clear to auscultation       Cardiovascular negative cardio ROS Normal cardiovascular exam Rhythm:Regular Rate:Normal     Neuro/Psych negative neurological ROS  negative psych ROS   GI/Hepatic negative GI ROS, Neg liver ROS,,,  Endo/Other  negative endocrine ROS    Renal/GU negative Renal ROS  negative genitourinary   Musculoskeletal negative musculoskeletal ROS (+)    Abdominal  (+) + obese  Peds negative pediatric ROS (+)  Hematology negative hematology ROS (+)   Anesthesia Other Findings   Reproductive/Obstetrics (+) Pregnancy                             Anesthesia Physical Anesthesia Plan  ASA: 2  Anesthesia Plan: Epidural   Post-op Pain Management:    Induction:   PONV Risk Score and Plan:   Airway Management Planned:   Additional Equipment:   Intra-op Plan:   Post-operative Plan:   Informed Consent:   Plan Discussed with:   Anesthesia Plan Comments:        Anesthesia Quick Evaluation  

## 2022-03-14 NOTE — Plan of Care (Signed)
  Problem: Education: Goal: Knowledge of Childbirth will improve Outcome: Adequate for Discharge Goal: Ability to make informed decisions regarding treatment and plan of care will improve Outcome: Adequate for Discharge Goal: Ability to state and carry out methods to decrease the pain will improve Outcome: Adequate for Discharge Goal: Individualized Educational Video(s) Outcome: Not Met (add Reason)   Problem: Coping: Goal: Ability to verbalize concerns and feelings about labor and delivery will improve Outcome: Adequate for Discharge   Problem: Life Cycle: Goal: Ability to make normal progression through stages of labor will improve Outcome: Completed/Met Goal: Ability to effectively push during vaginal delivery will improve Outcome: Completed/Met   Problem: Role Relationship: Goal: Will demonstrate positive interactions with the child Outcome: Adequate for Discharge   Problem: Safety: Goal: Risk of complications during labor and delivery will decrease Outcome: Completed/Met   Problem: Pain Management: Goal: Relief or control of pain from uterine contractions will improve Outcome: Completed/Met

## 2022-03-14 NOTE — Progress Notes (Signed)
Labor Progress Note Lori Frederick is a 26 y.o. G2P1001 at [redacted]w[redacted]d presented for PDIOL.   S: No acute concerns.   O:  BP 112/77   Pulse 89   Temp 97.9 F (36.6 C) (Oral)   Resp 17   Ht 5\' 4"  (1.626 m)   Wt 94.8 kg   LMP 05/31/2021   BMI 35.87 kg/m  EFM: 120bpm/moderate/+accels, no decels  CVE: Dilation: 4 Effacement (%): 50 Station: -1 Presentation: Vertex Exam by:: Mabe MD   A&P: 26 y.o. G2P1001 [redacted]w[redacted]d here for PDIOL.  #Labor: Contracting frequently. Does not desire AROM at this time.  #Pain: Maternally supported #FWB: Cat I  #GBS negative  Lori Nightengale Autry-Lott, DO 6:08 PM

## 2022-03-14 NOTE — Anesthesia Procedure Notes (Signed)
Epidural Patient location during procedure: OB Start time: 03/14/2022 8:22 PM End time: 03/14/2022 8:37 PM  Staffing Anesthesiologist: Lowella Curb, MD Performed: anesthesiologist   Preanesthetic Checklist Completed: patient identified, IV checked, site marked, risks and benefits discussed, surgical consent, monitors and equipment checked, pre-op evaluation and timeout performed  Epidural Patient position: sitting Prep: ChloraPrep Patient monitoring: heart rate, cardiac monitor, continuous pulse ox and blood pressure Approach: midline Injection technique: LOR air  Needle:  Needle type: Tuohy  Needle gauge: 17 G Needle length: 9 cm Needle insertion depth: 7 cm Catheter type: closed end flexible Catheter size: 20 Guage Catheter at skin depth: 11 cm Test dose: negative  Assessment Events: blood not aspirated, injection not painful, no injection resistance, no paresthesia and negative IV test  Additional Notes Reason for block:procedure for pain

## 2022-03-14 NOTE — Procedures (Signed)
Post-Placental IUD Insertion Procedure Note  Patient identified, informed consent signed prior to delivery, signed copy in chart, time out was performed.    Vaginal, labial and perineal areas thoroughly inspected for lacerations. Tiny 1st degree degree laceration identified - hemostatic, not repaired prior to insertion of IUD.  Mirena  - IUD inserted with inserter per manufacturer's instructions.    Strings trimmed to the level of the introitus. Patient tolerated procedure well.  Lot # [id number] Expiration Date: 2025/November  Patient given post procedure instructions and IUD care card with expiration date.  Patient is asked to keep IUD strings tucked in her vagina until her postpartum follow up visit in 4-6 weeks. Patient advised to abstain from sexual intercourse and pulling on strings before her follow-up visit. Patient verbalized an understanding of the plan of care and agrees.    Thressa Sheller DNP, CNM  03/14/22  10:24 PM

## 2022-03-14 NOTE — Progress Notes (Signed)
Labor Progress Note Lori Frederick is a 26 y.o. G2P1001 at [redacted]w[redacted]d presented for PDIOL.   S: No acute concerns.   O:  BP 133/69   Pulse 83   Temp 97.9 F (36.6 C) (Oral)   Resp 17   Ht 5\' 4"  (1.626 m)   Wt 94.8 kg   LMP 05/31/2021   BMI 35.87 kg/m  EFM: 115bpm/moderate/+accels, no decels  CVE: Dilation: 5 Effacement (%): 80 Station: -2 Presentation: Vertex Exam by:: Dr. [email or web address]   A&P: 26 y.o. G2P1001 [redacted]w[redacted]d here for PDIOL.  #Labor: S/P AROM #Pain: Maternally supported #FWB: Cat I  #GBS negative  Lori Renken Autry-Lott, DO 7:46 PM

## 2022-03-14 NOTE — Discharge Summary (Signed)
Postpartum Discharge Summary  Date of Service updated***     Patient Name: Lori Frederick DOB: 05-24-1995 MRN: [id number]  Date of admission: 03/14/2022 Delivery date:03/14/2022  Delivering provider: Marcille Buffy D  Date of discharge: 03/14/2022  Admitting diagnosis: Post-dates pregnancy [O48.0] Intrauterine pregnancy: [redacted]w[redacted]d    Secondary diagnosis:  Principal Problem:   Post-dates pregnancy  Additional problems: ***    Discharge diagnosis: Term Pregnancy Delivered                                              Post partum procedures: post-placental IUD insertion Augmentation: AROM and Cytotec Complications: None  Hospital course: Induction of Labor With Vaginal Delivery   26y.o. yo G2P1001 at 4103w0das admitted to the hospital 03/14/2022 for induction of labor.  Indication for induction: Postdates.  Patient had an labor course complicated by none  Membrane Rupture Time/Date: 7:29 PM ,03/14/2022   Delivery Method:Vaginal, Spontaneous  Episiotomy: None  Lacerations:  None  Details of delivery can be found in separate delivery note.  Patient had a postpartum course complicated by***. Patient is discharged home 03/14/22.  Newborn Data: Birth date:03/14/2022  Birth time:9:51 PM  Gender:Female  Living status:Living  Apgars:9 ,9  Weight:   Magnesium Sulfate received: No BMZ received: No Rhophylac:N/A MMR:N/A T-DaP:{Tdap:23962} Flu: {F{BMW:41324}ransfusion:{Transfusion received:30440034}  Physical exam  Vitals:   03/14/22 2115 03/14/22 2130 03/14/22 2155 03/14/22 2215  BP:  117/73 (!) 120/53 (!) 118/56  Pulse:  (!) 132 (!) 121 98  Resp: 17     Temp: (!) 97.3 F (36.3 C)     TempSrc: Oral     Weight:      Height:       General: {Exam; general:21111117} Lochia: {Desc; appropriate/inappropriate:30686::"appropriate"} Uterine Fundus: {Desc; firm/soft:30687} Incision: {Exam; incision:21111123} DVT Evaluation: {Exam; dvt:2111122} Labs: Lab Results   Component Value Date   WBC 8.9 03/14/2022   HGB 11.6 (L) 03/14/2022   HCT 33.4 (L) 03/14/2022   MCV 84.8 03/14/2022   PLT 172 03/14/2022       No data to display         EdLesothocore:    03/20/2020   10:14 AM  Edinburgh Postnatal Depression Scale Screening Tool  I have been able to laugh and see the funny side of things. 0  I have looked forward with enjoyment to things. 0  I have blamed myself unnecessarily when things went wrong. 0  I have been anxious or worried for no good reason. 0  I have felt scared or panicky for no good reason. 0  Things have been getting on top of me. 0  I have been so unhappy that I have had difficulty sleeping. 0  I have felt sad or miserable. 0  I have been so unhappy that I have been crying. 0  The thought of harming myself has occurred to me. 0  Edinburgh Postnatal Depression Scale Total 0     After visit meds:  Allergies as of 03/14/2022       Reactions   Ibuprofen Itching, Swelling     Med Rec must be completed prior to [facility]        Discharge home in stable condition Infant Feeding: {Baby feeding:23562} Infant Disposition:{CHL IP OB HOME WITH MOMWNUUV:25366}ischarge instruction: per After Visit Summary and Postpartum booklet. Activity: Advance as tolerated.  Pelvic rest for 6 weeks.  Diet: {OB TDVV:61607371} Future Appointments: Future Appointments  Date Time Provider [place]  04/07/2022  1:10 PM Janyth Pupa, DO [facility]   Follow up Visit:   Please schedule this patient for a In person postpartum visit in 6 weeks with the following provider: Any provider. Additional Postpartum F/U: None   Low risk pregnancy complicated by:  None Delivery mode:  Vaginal, Spontaneous  Anticipated Birth Control:  PP IUD placed   03/14/2022 Marcille Buffy, CNM

## 2022-03-14 NOTE — Progress Notes (Signed)
Lori Frederick is a 26 y.o. G2P1001 at [redacted]w[redacted]d admitted for induction of labor due to Post dates. Due date 03/07/2022.  Subjective: Patient just got an epidural. Comfortable now. Not feeling any pressure at this time.   Objective: BP 112/70   Pulse 81   Temp (!) 97.3 F (36.3 C) (Oral)   Resp 17   Ht 5\' 4"  (1.626 m)   Wt 94.8 kg   LMP 05/31/2021   BMI 35.87 kg/m  No intake/output data recorded. No intake/output data recorded.  FHT:  FHR: 115 bpm, variability: moderate,  accelerations:  Present,  decelerations:  Absent UC:   regular, every 2-3 minutes SVE:   Dilation: 9 Effacement (%): 100 Station: Plus 1 Exam by:: Ansah-Mensah, rnc  Labs: Lab Results  Component Value Date   WBC 8.9 03/14/2022   HGB 11.6 (L) 03/14/2022   HCT 33.4 (L) 03/14/2022   MCV 84.8 03/14/2022   PLT 172 03/14/2022    Assessment / Plan: IOL 2/2 post-dates Progressing with just cytotec and AROM  Labor: Progressing normally Preeclampsia:   NA Fetal Wellbeing:  Category I Pain Control:  Epidural I/D:  n/a Anticipated MOD:  NSVD  03/16/2022 DNP, CNM  03/14/22  9:38 PM

## 2022-03-14 NOTE — H&P (Cosign Needed Addendum)
OBSTETRIC ADMISSION HISTORY AND PHYSICAL  Lori Frederick is a 26 y.o. female G2P1001 with IUP at [redacted]w[redacted]d by LMP presenting for pdIOL. She reports +FMs, No LOF, no VB, no blurry vision, headaches or peripheral edema, and RUQ pain.  She plans on bottle feeding. She request pp mirena IUD for birth control. She received her prenatal [facility]   Dating: By LMP --->  Estimated Date of Delivery: 03/07/22  Sono:    @[redacted]w[redacted]d , CWD, normal anatomy, cephalic presentation, posterior placental lie, 2059g, 15% EFW   Prenatal History/Complications:  -Migraine headaches -Obesity BMI 31  Past Medical History: Past Medical History:  Diagnosis Date   Headache    Heart murmur     Past Surgical History: Past Surgical History:  Procedure Laterality Date   TONSILLECTOMY      Obstetrical History: OB History     Gravida  2   Para  1   Term  1   Preterm      AB      Living  1      SAB      IAB      Ectopic      Multiple      Live Births  1           Social History Social History   Socioeconomic History   Marital status: Single    Spouse name: Not on file   Number of children: Not on file   Years of education: Not on file   Highest education level: Not on file  Occupational History   Occupation: Vet tech  Tobacco Use   Smoking status: Former   Smokeless tobacco: Former    Quit date: 07/23/2019  Vaping Use   Vaping Use: Former   Quit date: 04/02/2019  Substance and Sexual Activity   Alcohol use: Not Currently   Drug use: Never   Sexual activity: Yes    Birth control/protection: None  Other Topics Concern   Not on file  Social History Narrative   Not on file   Social Determinants of Health   Financial Resource Strain: Not on file  Food Insecurity: No Food Insecurity (03/14/2022)   Hunger Vital Sign    Worried About Running Out of Food in the Last Year: Never true    Ran Out of Food in the Last Year: Never true  Transportation Needs: No Transportation  Needs (03/14/2022)   PRAPARE - 03/16/2022 (Medical): No    Lack of Transportation (Non-Medical): No  Physical Activity: Not on file  Stress: Not on file  Social Connections: Not on file    Family History: Family History  Adopted: Yes    Allergies: Allergies  Allergen Reactions   Ibuprofen Itching and Swelling    Medications Prior to Admission  Medication Sig Dispense Refill Last Dose   aspirin EC 81 MG tablet Take 1 tablet (81 mg total) by mouth daily. Swallow whole. 90 tablet 6    Prenatal Vit-Fe Fumarate-FA (PRENATAL VITAMINS) 28-0.8 MG TABS Take 1 tablet by mouth daily. 60 tablet 3      Review of Systems   All systems reviewed and negative except as stated in HPI  Blood pressure 112/77, pulse 89, temperature 97.9 F (36.6 C), temperature source Oral, resp. rate 17, height 5\' 4"  (1.626 m), weight 94.8 kg, last menstrual period 05/31/2021. General appearance: alert, cooperative, and no distress Lungs: no WOB on RA Heart: regular rate Abdomen: gravid abdomen Extremities: no sign  of DVT Presentation: cephalic Fetal monitoringBaseline: 120 bpm, Variability: Good {> 6 bpm), Accelerations: Reactive, and Decelerations: Absent Uterine activityFrequency: Every 2-3 minutes Dilation: 4 Effacement (%): 50 Station: -1 Exam by:: Mabe MD   Prenatal labs: ABO, Rh: --/--/A POS (12/22 1137) Antibody: NEG (12/22 1137) Rubella: 4.10 (05/23 0913) RPR: NON-REACTIVE (09/21 0829)  HBsAg: NON-REACTIVE (05/23 0913)  HIV: NON-REACTIVE (09/21 0829)  GBS: Negative/-- (11/20 0000)  1 hr Glucola wnl Genetic screening  Neg Anatomy US wnl  Prenatal Transfer Tool  Maternal Diabetes: No Genetic Screening: Normal Maternal Ultrasounds/Referrals: Normal Fetal Ultrasounds or other Referrals:  None Maternal Substance Abuse:  No Significant Maternal Medications:  None Significant Maternal Lab Results:  Group B Strep negative Number of Prenatal Visits:greater  than 3 verified prenatal visits Other Comments:  None  Results for orders placed or performed during the hospital encounter of 03/14/22 (from the past 24 hour(s))  Type and screen   Collection Time: 03/14/22 11:37 AM  Result Value Ref Range   ABO/RH(D) A POS    Antibody Screen NEG    Sample Expiration      03/17/2022,2359 Performed at [facility], [address], [place], [place] [address]   CBC   Collection Time: 03/14/22 11:57 AM  Result Value Ref Range   WBC 8.9 4.0 - 10.5 K/uL   RBC 3.94 3.87 - 5.11 MIL/uL   Hemoglobin 11.6 (L) 12.0 - 15.0 g/dL   HCT 98.9 (L) 21.1 - 94.1 %   MCV 84.8 80.0 - 100.0 fL   MCH 29.4 26.0 - 34.0 pg   MCHC 34.7 30.0 - 36.0 g/dL   RDW 74.0 81.4 - 48.1 %   Platelets 172 150 - 400 K/uL   nRBC 0.0 0.0 - 0.2 %    Patient Active Problem List   Diagnosis Date Noted   Post-dates pregnancy 03/14/2022   Supervision of other normal pregnancy, antepartum 08/13/2021   Maternal obesity affecting pregnancy, antepartum 07/19/2019   Migraine without aura 10/13/2011    Assessment/Plan:  Lori Frederick is a 26 y.o. G2P1001 at [redacted]w[redacted]d here for pdIOL.   #Labor: Latent. Cytotec 50/25. Will assess change at next check. Plan for AROM v. pitocin #Pain: Nitrous oxide, will consider IV fentanyl as needed #FWB: Cat 1 #ID:  GBS neg #MOF: Bottle #MOC: PP IUD #Circ: Yes  Janeal Holmes, MD  03/14/2022, 1:12 PM  GME ATTESTATION:  I saw and evaluated the patient. I agree with the findings and the plan of care as documented in the resident's note. I have made changes to documentation as necessary.  Lavonda Jumbo, DO OB Fellow, Faculty [facility], Center for [facility] 03/14/2022, 1:29 PM

## 2022-03-15 LAB — RPR: RPR Ser Ql: NONREACTIVE

## 2022-03-15 MED ORDER — OXYCODONE HCL 5 MG PO TABS: 5.0000 mg | ORAL_TABLET | ORAL | Status: DC | PRN

## 2022-03-15 MED ORDER — DIBUCAINE (PERIANAL) 1 % EX OINT: 1.0000 | TOPICAL_OINTMENT | CUTANEOUS | Status: DC | PRN

## 2022-03-15 MED ORDER — OXYCODONE HCL 5 MG PO TABS: 10.0000 mg | ORAL_TABLET | ORAL | Status: DC | PRN

## 2022-03-15 MED ORDER — PRENATAL MULTIVITAMIN CH
1.0000 | ORAL_TABLET | Freq: Every day | ORAL | Status: DC
  Administered 2022-03-15 – 2022-03-16 (×2): 1 via ORAL
  Filled 2022-03-15: qty 1

## 2022-03-15 MED ORDER — BENZOCAINE-MENTHOL 20-0.5 % EX AERO: 1.0000 | INHALATION_SPRAY | CUTANEOUS | Status: DC | PRN

## 2022-03-15 MED ORDER — TETANUS-DIPHTH-ACELL PERTUSSIS 5-2.5-18.5 LF-MCG/0.5 IM SUSY: 0.5000 mL | PREFILLED_SYRINGE | Freq: Once | INTRAMUSCULAR | Status: DC

## 2022-03-15 MED ORDER — SENNOSIDES-DOCUSATE SODIUM 8.6-50 MG PO TABS
2.0000 | ORAL_TABLET | ORAL | Status: DC
  Administered 2022-03-15 – 2022-03-16 (×2): 2 via ORAL
  Filled 2022-03-15: qty 2

## 2022-03-15 MED ORDER — ONDANSETRON HCL 4 MG PO TABS: 4.0000 mg | ORAL_TABLET | ORAL | Status: DC | PRN

## 2022-03-15 MED ORDER — ZOLPIDEM TARTRATE 5 MG PO TABS: 5.0000 mg | ORAL_TABLET | Freq: Every evening | ORAL | Status: DC | PRN

## 2022-03-15 MED ORDER — DIPHENHYDRAMINE HCL 25 MG PO CAPS: 25.0000 mg | ORAL_CAPSULE | Freq: Four times a day (QID) | ORAL | Status: DC | PRN

## 2022-03-15 MED ORDER — SIMETHICONE 80 MG PO CHEW: 80.0000 mg | CHEWABLE_TABLET | ORAL | Status: DC | PRN

## 2022-03-15 MED ORDER — ONDANSETRON HCL 4 MG/2ML IJ SOLN: 4.0000 mg | INTRAMUSCULAR | Status: DC | PRN

## 2022-03-15 MED ORDER — WITCH HAZEL-GLYCERIN EX PADS: 1.0000 | MEDICATED_PAD | CUTANEOUS | Status: DC | PRN

## 2022-03-15 MED ORDER — COCONUT OIL OIL: 1.0000 | TOPICAL_OIL | Status: DC | PRN

## 2022-03-15 MED ORDER — ACETAMINOPHEN 325 MG PO TABS
650.0000 mg | ORAL_TABLET | ORAL | Status: DC | PRN
  Administered 2022-03-15 (×2): 650 mg via ORAL
  Filled 2022-03-15 (×2): qty 2

## 2022-03-15 NOTE — Anesthesia Postprocedure Evaluation (Signed)
Anesthesia Post Note  Patient: Robyn Garfinkle  Procedure(s) Performed: AN AD HOC LABOR EPIDURAL     Patient location during evaluation: Mother Baby Anesthesia Type: Epidural Level of consciousness: awake and alert Pain management: pain level controlled Vital Signs Assessment: post-procedure vital signs reviewed and stable Respiratory status: spontaneous breathing, nonlabored ventilation and respiratory function stable Cardiovascular status: stable Postop Assessment: no headache, no backache and epidural receding Anesthetic complications: no   No notable events documented.  Last Vitals:  Vitals:   03/15/22 0637 03/15/22 1005  BP: 114/60 114/82  Pulse: 79 74  Resp: 18 16  Temp: 36.6 C 36.6 C  SpO2: 98% 98%    Last Pain:  Vitals:   03/15/22 1245  TempSrc:   PainSc: 3    Pain Goal:                   Salome Arnt

## 2022-03-15 NOTE — Progress Notes (Signed)
POSTPARTUM PROGRESS NOTE  Post Partum Day 1  Subjective:  Lori Frederick is a 26 y.o. Y1E5631 s/p VD at [redacted]w[redacted]d.  She reports she is doing well. No acute events overnight. She denies any problems with ambulating, voiding or po intake. Denies nausea or vomiting.  Pain is well controlled.  Lochia is appropriate.  Objective: Blood pressure 114/82, pulse 74, temperature 97.9 F (36.6 C), temperature source Oral, resp. rate 16, height 5\' 4"  (1.626 m), weight 94.8 kg, last menstrual period 05/31/2021, SpO2 98 %, unknown if currently breastfeeding.  Physical Exam:  General: alert, cooperative and no distress Chest: no respiratory distress Heart:regular rate, distal pulses intact Abdomen: soft, nontender,  Uterine Fundus: firm, appropriately tender DVT Evaluation: No calf swelling or tenderness Extremities: No LE edema Skin: warm, dry  Recent Labs    03/14/22 1157  HGB 11.6*  HCT 33.4*    Assessment/Plan: Lori Frederick is a 26 y.o. 25 s/p VD at [redacted]w[redacted]d   PPD#1 - Doing well  Routine postpartum care Contraception: ppIUD placed Feeding: Bottle Dispo: Plan for discharge 12/24.   LOS: 1 day   1/25, DO OB Fellow, Faculty [facility], Center for [facility] 03/15/2022, 10:07 AM

## 2022-03-15 NOTE — Plan of Care (Signed)
  Problem: Education: Goal: Knowledge of General Education information will improve Description: Including pain rating scale, medication(s)/side effects and non-pharmacologic comfort measures Outcome: Progressing   Problem: Health Behavior/Discharge Planning: Goal: Ability to manage health-related needs will improve Outcome: Progressing   Problem: Clinical Measurements: Goal: Ability to maintain clinical measurements within normal limits will improve Outcome: Progressing Goal: Will remain free from infection Outcome: Progressing Goal: Cardiovascular complication will be avoided Outcome: Progressing   Problem: Activity: Goal: Risk for activity intolerance will decrease Outcome: Progressing   Problem: Nutrition: Goal: Adequate nutrition will be maintained Outcome: Progressing   Problem: Coping: Goal: Level of anxiety will decrease Outcome: Progressing   Problem: Elimination: Goal: Will not experience complications related to bowel motility Outcome: Progressing Goal: Will not experience complications related to urinary retention Outcome: Progressing   Problem: Pain Managment: Goal: General experience of comfort will improve Outcome: Progressing   Problem: Safety: Goal: Ability to remain free from injury will improve Outcome: Progressing   Problem: Skin Integrity: Goal: Risk for impaired skin integrity will decrease Outcome: Progressing   Problem: Education: Goal: Knowledge of condition will improve Outcome: Progressing Goal: Individualized Educational Video(s) Outcome: Progressing Goal: Individualized Newborn Educational Video(s) Outcome: Progressing   Problem: Activity: Goal: Will verbalize the importance of balancing activity with adequate rest periods Outcome: Progressing Goal: Ability to tolerate increased activity will improve Outcome: Progressing   Problem: Coping: Goal: Ability to identify and utilize available resources and services will  improve Outcome: Progressing   Problem: Life Cycle: Goal: Chance of risk for complications during the postpartum period will decrease Outcome: Progressing   Problem: Role Relationship: Goal: Ability to demonstrate positive interaction with newborn will improve Outcome: Progressing   Problem: Skin Integrity: Goal: Demonstration of wound healing without infection will improve Outcome: Progressing

## 2022-03-16 DIAGNOSIS — Z975 Presence of (intrauterine) contraceptive device: Secondary | ICD-10-CM

## 2022-03-16 MED ORDER — ACETAMINOPHEN 325 MG PO TABS: 650.0000 mg | ORAL_TABLET | ORAL | Status: DC | PRN

## 2022-03-25 ENCOUNTER — Telehealth (HOSPITAL_COMMUNITY): Payer: Self-pay | Admitting: *Deleted

## 2022-03-25 NOTE — Telephone Encounter (Signed)
Left phone voicemail message.  Odis Hollingshead, RN 03-25-2022 at 9:02am

## 2022-04-04 NOTE — Progress Notes (Unsigned)
    [place] Partum Visit Note  Lori Frederick is a 27 y.o. G27P2002 female who presents for a postpartum visit. She is 4 weeks postpartum following a normal spontaneous vaginal delivery.  I have fully reviewed the prenatal and intrapartum course. The delivery was at [redacted]w[redacted]d gestational weeks.  Anesthesia: epidural. Postpartum course has been ***. Baby is doing well***. Baby is feeding by {breastmilk/bottle:69}. Bleeding {vag bleed:12292}. Bowel function is {normal:32111}. Bladder function is {normal:32111}. Patient {is/is not:9024} sexually active. Contraception method is IUD- placed in the hospital. Postpartum depression screening: {gen negative/positive:315881}.   The pregnancy intention screening data noted above was reviewed. Potential methods of contraception were discussed. The patient elected to proceed with No data recorded.    Health Maintenance Due  Topic Date Due   COVID-19 Vaccine (1) Never done   HPV VACCINES (1 - 2-dose series) Never done   INFLUENZA VACCINE  10/22/2021    {Common ambulatory SmartLinks:19316}  Review of Systems {ros; complete:30496}  Objective:  There were no vitals taken for this visit.   General:  {gen appearance:16600}   Breasts:  {desc; normal/abnormal/not indicated:14647}  Lungs: {lung exam:16931}  Heart:  {heart exam:5510}  Abdomen: {abdomen exam:16834}   Wound {Wound assessment:11097}  GU exam:  {desc; normal/abnormal/not indicated:14647}       Assessment:    There are no diagnoses linked to this encounter.  *** postpartum exam.   Plan:   Essential components of care per ACOG recommendations:  1.  Mood and well being: Patient with {gen negative/positive:315881} depression screening today. Reviewed local resources for support.  - Patient tobacco use? {tobacco use:25506}  - hx of drug use? {yes/no:25505}    2. Infant care and feeding:  -Patient currently breastmilk feeding? {yes/no:25502}  -Social determinants of health (SDOH)  reviewed in EPIC. No concerns***The following needs were identified***  3. Sexuality, contraception and birth spacing - Patient {DOES_DOES FAO:13086} want a pregnancy in the next year.  Desired family size is {NUMBER 1-10:22536} children.  - Reviewed reproductive life planning. Reviewed contraceptive methods based on pt preferences and effectiveness.  Patient desired {Upstream End Methods:24109} today.   - Discussed birth spacing of 18 months  4. Sleep and fatigue -Encouraged family/partner/community support of 4 hrs of uninterrupted sleep to help with mood and fatigue  5. Physical Recovery  - Discussed patients delivery and complications. She describes her labor as {description:25511} - Patient had a {CHL AMB DELIVERY:[phone]}. Patient had a {laceration:25518} laceration. Perineal healing reviewed. Patient expressed understanding - Patient has urinary incontinence? {yes/no:25515} - Patient {ACTION; IS/IS VHQ:[id number]} safe to resume physical and sexual activity  6.  Health Maintenance - HM due items addressed {Yes or If no, why not?:20788} - Last pap smear  Diagnosis  Date Value Ref Range Status  07/19/2019   Final   - Negative for intraepithelial lesion or malignancy (NILM)   Pap smear {done:10129} at today's visit.  -Breast Cancer screening indicated? {indicated:25516}  7. Chronic Disease/Pregnancy Condition follow up: {Follow up:25499}  - PCP follow up  Lyndal Rainbow, [place] for [employer], [place]

## 2022-04-07 ENCOUNTER — Ambulatory Visit (INDEPENDENT_AMBULATORY_CARE_PROVIDER_SITE_OTHER): Payer: Medicaid Other | Admitting: Obstetrics & Gynecology

## 2022-04-07 ENCOUNTER — Encounter: Payer: Self-pay | Admitting: Obstetrics & Gynecology

## 2022-04-07 ENCOUNTER — Other Ambulatory Visit (HOSPITAL_COMMUNITY)
Admission: RE | Admit: 2022-04-07 | Discharge: 2022-04-07 | Disposition: A | Discharge status: Home / Self Care | Payer: Medicaid Other | Source: Ambulatory Visit | Attending: Obstetrics & Gynecology | Admitting: Obstetrics & Gynecology

## 2022-04-07 DIAGNOSIS — Z975 Presence of (intrauterine) contraceptive device: Secondary | ICD-10-CM | POA: Diagnosis not present

## 2022-04-07 DIAGNOSIS — Z124 Encounter for screening for malignant neoplasm of cervix: Secondary | ICD-10-CM

## 2022-04-07 NOTE — Progress Notes (Signed)
    [facility] Partum Visit Note  Lori Frederick is a 27 y.o. G110P2002 female who presents for a postpartum visit. She is 4 weeks postpartum following a normal spontaneous vaginal delivery.  I have fully reviewed the prenatal and intrapartum course. The delivery was at [redacted]w[redacted]d gestational weeks.  Anesthesia: epidural. Postpartum course has been unremarkable. Baby is doing well. Baby is feeding by bottle - Enfamil AR. Bleeding no bleeding. Bowel function is normal. Bladder function is normal. Patient is not sexually active. Contraception method is IUD- placed in the hospital. Postpartum depression screening: negative.   The pregnancy intention screening data noted above was reviewed. Potential methods of contraception were discussed. The patient elected to proceed with No data recorded.    Health Maintenance Due  Topic Date Due   COVID-19 Vaccine (1) Never done   HPV VACCINES (1 - 2-dose series) Never done   INFLUENZA VACCINE  10/22/2021    The following portions of the patient's history were reviewed and updated as appropriate: allergies, current medications, past family history, past medical history, past social history, and past surgical history.  Review of Systems Pertinent items are noted in HPI.  Objective:  BP 106/74   Pulse (!) 57   Ht 5\' 4"  (1.626 m)   Wt 181 lb (82.1 kg)   Breastfeeding No   BMI 31.07 kg/m    General:  alert, cooperative, and no distress   Breasts:  not indicated  Lungs: clear to auscultation bilaterally  Heart:  regular rate and rhythm  Abdomen: Soft and non-tender   Wound N/a  GU exam:  Vulva:  normal appearing vulva with no masses, tenderness or lesions Vagina:  normal mucosa, no discharge Cervix:  Normal no lesions, strings visualized at os Uterus:  normal size, contour, position, consistency, mobility, non-tender Adnexa: ovaries:present,  normal adnexa in size, nontender and no masses        Assessment:   Postpartum visit IUD in  place Cervical cancer screening  Plan:   Essential components of care per ACOG recommendations:  1.  Mood and well being: appropriate Reviewed local resources for support.  - Patient tobacco use? No.   - hx of drug use? No.    2. Infant care and feeding:  -Patient currently breastmilk feeding? No.  -Social determinants of health (SDOH) reviewed in EPIC. No concerns  3. Sexuality, contraception and birth spacing - Patient does not want a pregnancy in the next year.  -IUD in place, pt checks strings on her own  4. Sleep and fatigue -Encouraged family/partner/community support of 4 hrs of uninterrupted sleep to help with mood and fatigue  5. Physical Recovery  - Discussed patients delivery and complications. She describes her labor as good. - Patient had a Vaginal, no problems at delivery. Patient did not have a laceration. Perineal healing reviewed. Patient expressed understanding - Patient has urinary incontinence? No. - Patient is safe to resume physical and sexual activity  6.  Health Maintenance - Last pap smear  Diagnosis  Date Value Ref Range Status  07/19/2019   Final   - Negative for intraepithelial lesion or malignancy (NILM)   Pap smear done at today's visit.  -Breast Cancer screening indicated? No.   7. Chronic Disease/Pregnancy Condition follow up: None   Lori Frederick, [place] [facility] [place]

## 2022-04-11 LAB — CYTOLOGY - PAP
Comment: NEGATIVE
Diagnosis: UNDETERMINED — AB
High risk HPV: NEGATIVE

## 2024-04-15 ENCOUNTER — Encounter: Payer: Self-pay | Admitting: Family Medicine

## 2024-04-15 ENCOUNTER — Ambulatory Visit: Admitting: Family Medicine

## 2024-04-15 VITALS — BP 116/74 | HR 66 | Ht 62.0 in | Wt 201.0 lb

## 2024-04-15 DIAGNOSIS — R6882 Decreased libido: Secondary | ICD-10-CM

## 2024-04-15 DIAGNOSIS — F520 Hypoactive sexual desire disorder: Secondary | ICD-10-CM

## 2024-04-15 MED ORDER — BUPROPION HCL ER (XL) 150 MG PO TB24: 150.0000 mg | ORAL_TABLET | Freq: Every day | ORAL | 1 refills | Status: AC

## 2024-04-15 NOTE — Progress Notes (Signed)
 History:  Lori Frederick is a 29 y.o. G2P2002 who presents to clinic today for low sex drive. Started after IUD was placed at her postpartum visit January 2024. Since then, she reports that sex is not a priority.  She reports her main concern is low desire. She reports the last times she was satisfied with her sex life was before having children. She started noticing a bothersome change after delivering her second child in 2024 about 2 years ago. She wonders if her IUD may have contributed. She does believe that stress, fatigue, and limited privacy are contributing. She does feel safe in her current relationship. She has not experienced sexual abuse or assault. She describes her current relationship as strained. They spend no time together as a couple without family or friends present. This issue was still present on their last vacation together. She states that this is causing stress in the relationship. They have not met with a counselor or sex therapist individually or together. These issues are causing her significant distress. She denies pain with intercourse. She also notes she has been making efforts to lose weight to improve body image.  The following portions of the patient's history were reviewed and updated as appropriate: allergies, current medications, family history, past medical history, social history, past surgical history and problem list.  Review of Systems:  ROS See HPI   Objective:  Physical Exam BP 116/74 (BP Location: Right Arm, Patient Position: Sitting, Cuff Size: Normal)   Pulse 66   Ht 5' 2 (1.575 m)   Wt 201 lb (91.2 kg)   BMI 36.76 kg/m  Physical Exam Constitutional:      General: She is not in acute distress.    Appearance: Normal appearance.  HENT:     Head: Normocephalic and atraumatic.  Pulmonary:     Effort: Pulmonary effort is normal. No respiratory distress.  Musculoskeletal:     Cervical back: Normal range of motion.  Neurological:      General: No focal deficit present.     Mental Status: She is alert and oriented to person, place, and time. Mental status is at baseline.  Psychiatric:        Mood and Affect: Mood normal.        Thought Content: Thought content normal.        Judgment: Judgment normal.     Assessment & Plan:  1. Low libido (Primary) The patient has endorsed a lack of sexual desire, impaired arousal, inability to achieve orgasm, or pain with sexual activity associated with distress. 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 [x] Absent/reduced sexual/erotic thoughts or fantasies. [x] No/reduced initiation of sexual activity, and typically unreceptive to a partners attempts to initiate. [] Absent/reduced sexual excitement/pleasure during sexual activity in almost all or all (approximately 75 to 100 percent) sexual encounters (in identified situational contexts or, if generalized, in all contexts). [] Absent/reduced sexual interest/arousal in response to any internal or external sexual/erotic cues (eg, written, verbal, visual). [] Absent/reduced genital or nongenital sensations during sexual activity in almost all or all (approximately 75 to 100 percent) sexual encounters (in identified situational contexts or, if generalized, in all contexts). Discussed that changes in libido are generally multifactorial. Discussed management options including medication, individual therapy, couples therapy, stress management, and date nights. Patient would like to work on having date nights and try medication. In the future, she may be interested in therapy. Encouraged to continue with physical  activity and balanced diet. May try magnesium glycinate to improve sleep. Discussed medication options bupropion and Addyi. Will initiate bupropion as this is also moderately helpful in weight management. Follow up in 4-6 weeks  for medication management. May consider dose adjustment, change in therapy, adding individual/couples therapy at that time. - buPROPion (WELLBUTRIN XL) 150 MG 24 hr tablet; Take 1 tablet (150 mg total) by mouth daily.  Dispense: 30 tablet; Refill: 1  2. Female with lack of sexual interest - buPROPion (WELLBUTRIN XL) 150 MG 24 hr tablet; Take 1 tablet (150 mg total) by mouth daily.  Dispense: 30 tablet; Refill: 1   Joesph DELENA Sear, PA

## 2024-04-15 NOTE — Patient Instructions (Addendum)
 Magnesium glycinate supplement: https://a.co/d/1zt1ID3   You can start bupropion  tomorrow morning. This is helpful with managing mood and can increase energy. It can also provide moderate benefit in weight management. Your homework is to try to have two dates with your partner before we follow up in 4-6 weeks :)  SEXUAL PROBLEMS: There are a number of factors that affect sexual desire during our lives, you are not alone! Changes in sexual desire are very common. In the [place] , approximately 40 percent of females have sexual concerns and 12 percent report distressing sexual problems. In general, females are most likely to be satisfied with their sex lives if they are physically and psychologically healthy and have a good relationship with their partner. People who are not in a partnered relationship also may experience sexual problems. Although a host of changes in hormones, blood vessels, the brain, and vaginal area can affect sexuality, relationship difficulties and poor physical or psychological well-being contribute to many sexual problems.  RISK FACTORS FOR SEXUAL PROBLEMS  There are a number of risk factors that may contribute to sexual problems in females. A risk factor is not necessarily the cause of a problem, but rather something that makes the problem more likely. Personal well-being -- Your sense of personal well-being is important to sexual interest and activity. Diet and exercise habits can affect body image. Not feeling your best physically or emotionally may contribute to a decrease in sexual interest or response. Fatigue and stress -- It is common to feel less interested in sex and experience reduced sexual pleasure when you are tired or under stress. Fatigue can result from an underlying medical problem, poor sleep, or simply not getting enough rest due to the demands of family, work, and other responsibilities. Sociocultural factors -- Lack of privacy and personal, religious,  and cultural beliefs about sex may contribute to sexual problems. Relationship issues -- An emotionally healthy relationship with current and past sexual partners is a critical factor in sexual satisfaction. Stress in a relationship, conflict with your partner(s), and limited communication can negatively influence your sexual desire and response. Current or past emotional, physical, or sexual abuse or trauma often contribute to sexual problems. In addition, it is normal for even healthy relationships to become less exciting sexually over time. Partner health or sexual problems -- Sexual dysfunction in a partner can affect your sexual response. If you have a female partner, sexual problems (including erectile dysfunction, diminished libido, and abnormal ejaculation) can occur at any time, but become more common with advancing age. In addition, females tend to live longer than males, resulting in fewer healthy, sexually functional female partners over time. If you have a female partner, she can also experience sexual problems that may impact your sexual satisfaction. Gynecologic issues Childbirth -- After childbirth, it is common to experience decreased sexual desire. Contributing factors may include physical recovery and breastfeeding as well as fatigue and the demands of parenting. This can happen whether you gave birth vaginally or had a cesarean birth (c-section). Low estrogen levels after delivery and local injury to the genital area or abdominal wall at delivery may result in pain with sexual activity. In most cases, these issues improve with time. Menopause -- Estrogen is a hormone produced by the ovaries. During the several years before menopause (when monthly periods stop), estrogen levels begin to fluctuate. After menopause, estrogen levels decline dramatically. This may contribute to changes in your libido and ability to become aroused. Hot flashes, night sweats, sleep disruption, and  fatigue, which  commonly occur with menopause, also may contribute to sexual problems. There are treatments that can help with these symptoms.  In addition, many people experience discomfort or pain during sex after menopause due to vaginal dryness, loss of normal secretions and lubrication, decreased elasticity, and narrowing of the vagina. Menopausal vaginal changes are generally more severe if intercourse (or other activities that involve vaginal penetration) is infrequent. Although hot flashes and most menopausal symptoms improve with time, vaginal dryness and resulting painful sex generally worsen with time, if not treated.  Hysterectomy -- In general, hysterectomy (removal of the uterus) does not cause sexual dysfunction. Most studies actually show an improvement in sexual function after hysterectomy, likely due to resolution of symptoms that interfere with sex, such as heavy bleeding or pain. Removal of the cervix at the time of hysterectomy also has no negative effect on sexuality. Removal of the ovaries at the time of hysterectomy, typically done to decrease the risk of ovarian cancer, reduces estrogen and androgen levels, which may impact sexual function for some people.  Vaginal or pelvic pain -- Vaginal or pelvic pain is a common cause of sexual dysfunction. Pain during sex may lead to fear of further pain, which can diminish lubrication and cause involuntary tightening of the pelvic muscles, resulting in further pain.  Pain may be caused by endometriosis, vaginal or pelvic surgery, infection, inflammation, or scar tissue. In people who have gone through menopause, a lack of estrogen often causes discomfort with intercourse and other forms of sexual activity.  Bladder and pelvic support issues -- Changes in the bladder or loss of pelvic support (pelvic organ prolapse) can lead to loss of urine or stool (incontinence) or sensations of vaginal pressure. Involuntary tightening of these muscles also can occur. These  symptoms may interfere with sexual desire and activity.  Medical issues -- Almost any serious medical problem can impact sexual desire and responsiveness. Problems such as heart disease, arthritis, and obesity can affect a person's physical ability to have pleasurable sex. People with cancer can experience discomfort, fatigue, and mood changes due to both the disease and its treatments, which often impact sexual function. Changes in body image, especially after surgery involving the breasts, vagina, or pelvis, can also contribute to sexual problems. Other conditions, such as Parkinson disease, diabetes, or substance use disorders (involving alcohol, marijuana, pain medications, or other drugs), can impair libido, arousal, and ability to experience orgasm. Psychiatric or emotional problems may significantly impact sexual function, either due to the disease itself or its treatment. Depression is one of the most common causes of decreased libido and other sexual disorders in females. Anxiety is another common cause of sexual problems. Medications -- Both prescription and nonprescription medications can alter sexual desire, arousal, orgasm, and pain. This may include: ?Many antidepressants (especially selective serotonin reuptake inhibitors) ?Some antipsychotic medications (used for psychiatric problems as well as sleep disorders and other conditions) ?Beta blockers (used to treat high blood pressure) ?Antiestrogens/aromatase inhibitors (used to treat breast cancer) ?Gonadotropin-releasing hormone agonists and antagonists (used to treat endometriosis or fibroids) It is not clear if hormone-containing medications, such as birth control pills and menopausal hormone therapy, affect sexual function. Studies have shown mixed results, with some studies showing that hormonal medications have no effect but others showing worsening or improvement of sexual problems.  TREATMENT OPTIONS A number of treatments are  available for females with sexual problems. In many cases, a combination of treatments is most effective. Managing stress, fatigue, and relationship issues --  Strategies to reduce life stress, including exercise, yoga, massage, meditation, and other mind/body techniques, can result in a more satisfying sex life. If possible, adjusting work schedules and obtaining help with childcare and household responsibilities can often improve quality of life and sexual satisfaction. Prioritizing sleep and trying to find other ways to reduce fatigue may also improve sexual interest. Working with a pharmacist, hospital can help you to reduce stress and strengthen your relationships. Sex therapists are professionals with special expertise in helping individuals and couples address sexual problems by providing information, improving communication, and giving instruction in specific exercises to improve intimacy and mutual pleasure. Many couples have better sex while on vacation, demonstrating the importance of reducing stress and fatigue to improve sexual satisfaction. Couples who have more fun together outside of the bedroom typically are more satisfied with their sex lives, so establishing a regular date night and increasing the frequency of time alone together can reduce sexual problems. While this can be challenging to do given competing demands from work, family, and other priorities, it can help your sex life to have time set aside for just you and your partner. Counseling, books, and web sites about sexuality help couples communicate better about their sexual needs and differences, understand the causes of their difficulties, and provide treatment suggestions.  Novelty -- Increasing novelty often sparks sexual desire and enhances sexual response. Try sensual massage, sharing a bath, experimenting with different sexual positions or activities, using candles and music, or having sex in the middle of the day or outside  of the bedroom. Books, films, vibrators or other sex toys, and lubricants can also add excitement. Vibrators are the most effective treatment for orgasm difficulties. They can be used with or without a partner. Treating vaginal dryness -- People with vaginal dryness that makes sex uncomfortable may benefit from the use of a lubricant. These products are designed to reduce friction during sexual activity and may result in greater comfort and pleasure. In addition, using a long-acting, nonhormonal vaginal moisturizer on a regular basis (several times weekly) reduces vaginal dryness. Both lubricants and moisturizers are available over-the-counter and do not require a prescription. It's best to use products without color, scents, or other additives in order to minimize vaginal irritation. People who have been through menopause generally will benefit from the use of low-dose vaginal estrogen therapy to treat vaginal dryness and painful sex associated with menopause (brand names: Vagifem, Yuvafem, Estring, Estrace cream, Premarin cream). Treatment of vaginal dryness is discussed in detail in a separate topic.  Improving painful sex -- Many people who have pain with sex have tight and tender muscles and connective tissue in the pelvis, lower belly, thighs, groin, and buttocks. Pelvic floor physical therapy (PT) can significantly decrease discomfort associated with involuntary tightening of pelvic floor muscles. Physical therapists who perform this type of PT are specially trained in pelvic manipulation and rehabilitation. Often, painful sex is due to narrowing and shortening of the vagina after surgery or menopause, or involuntary tightening of the muscles of the pelvis and vagina, called provoked pelvic floor hypertonus. This is best treated by purchasing a set of vaginal dilators and gently stretching the vagina over several months. A well-lubricated dilator of the appropriate size is placed into the vagina several  times for approximately five minutes daily. The size of the dilator is gradually increased until intercourse is once again comfortable. These exercises are best guided by a gynecologist or pelvic floor physical therapist. Adjusting medications with sexual side effects --  If you have sexual side effects from a medication, speak with your health care provider about options for reducing the dose or finding an effective alternative medication. Options for people who have side effects from an antidepressant medication include trying a reduced dose or change in type of antidepressant medication. Bupropion  (brand name: Wellbutrin ), nefazodone (brand name: Serzone), mirtazapine (brand name: Remeron), or duloxetine (brand name: Cymbalta) are antidepressant medications that have few or no sexual side effects, and can sometimes be used in addition to or in place of your current medication. Talk to your health care provider before making any changes in your medications. Depression also is a common cause of sexual problems, so it is important that the depression is managed effectively. Medication -- there are some medications that can help with other factors that may be contributing to sexual problems. ?Bupropion  (brand name: Wellbutrin ) - This medication is a pill that you take every day. It can be helpful particularly for people with low mood and/or working on weight management. It can also improve energy levels. This medication should be taken in the morning to avoid impacts on sleep. ?Buspirone  - This medication comes in a pill that you take every day. It is helpful for people who find that anxiety is causing sexual problems. In the [place] , two medications have been approved for treating low sexual desire in premenopausal females with low desire for sex that is causing them distress (called hypoactive sexual desire disorder). Both must be prescribed by a health care provider. While they may slightly improve  sexual desire, there can be serious side effects. Insurance also may not cover these medicines as often. In most cases, health care providers recommend trying other things to improve your relationship and sex life before trying either of these medications. Medication options include: ?Flibanserin (brand name: Addyi) - This medication comes in a pill that you take every day. Some people who take it have problems with fatigue, nausea, dizziness, or headaches. Alcohol, as well as certain other medications, can increase the risk of side effects. If you have one or two alcoholic drinks, you should wait at least two hours before taking flibanserin. If you have three or more drinks, you should not take flibanserin at all that day. ?Bremelanotide (brand name: Vyleesi) - This medication comes in the form of an injection that you give yourself approximately 45 minutes before you plan to have sex. Side effects can include nausea, vomiting, or flushing (when your skin turns red and hot). Darkening of areas of skin can occur, including on the face, which may be permanent. A few people might have a rise in blood pressure. People with high blood pressure or heart problems cannot take bremelanotide.

## 2024-05-16 ENCOUNTER — Ambulatory Visit: Admitting: Obstetrics and Gynecology
